# Patient Record
Sex: Female | Born: 1955 | Race: White | Hispanic: No | Marital: Single | Smoking: Current every day smoker
Health system: Southern US, Community
[De-identification: ages and names within clinical notes are randomized; demographics above are authoritative.]

## PROBLEM LIST (undated history)

## (undated) DIAGNOSIS — J449 Chronic obstructive pulmonary disease, unspecified: Secondary | ICD-10-CM

## (undated) DIAGNOSIS — T7840XA Allergy, unspecified, initial encounter: Secondary | ICD-10-CM

## (undated) DIAGNOSIS — C20 Malignant neoplasm of rectum: Secondary | ICD-10-CM

## (undated) DIAGNOSIS — M810 Age-related osteoporosis without current pathological fracture: Secondary | ICD-10-CM

## (undated) DIAGNOSIS — I10 Essential (primary) hypertension: Secondary | ICD-10-CM

## (undated) DIAGNOSIS — M1990 Unspecified osteoarthritis, unspecified site: Secondary | ICD-10-CM

## (undated) DIAGNOSIS — Z5189 Encounter for other specified aftercare: Secondary | ICD-10-CM

## (undated) DIAGNOSIS — J439 Emphysema, unspecified: Secondary | ICD-10-CM

## (undated) DIAGNOSIS — Z8489 Family history of other specified conditions: Secondary | ICD-10-CM

## (undated) DIAGNOSIS — C801 Malignant (primary) neoplasm, unspecified: Secondary | ICD-10-CM

## (undated) DIAGNOSIS — F32A Depression, unspecified: Secondary | ICD-10-CM

## (undated) HISTORY — PX: DILATION AND CURETTAGE OF UTERUS: SHX78

## (undated) HISTORY — PX: COLON SURGERY: SHX602

## (undated) HISTORY — DX: Emphysema, unspecified: J43.9

## (undated) HISTORY — DX: Chronic obstructive pulmonary disease, unspecified: J44.9

## (undated) HISTORY — DX: Malignant neoplasm of rectum: C20

## (undated) HISTORY — PX: OTHER SURGICAL HISTORY: SHX169

## (undated) HISTORY — DX: Encounter for other specified aftercare: Z51.89

## (undated) HISTORY — DX: Essential (primary) hypertension: I10

## (undated) HISTORY — DX: Depression, unspecified: F32.A

## (undated) HISTORY — DX: Allergy, unspecified, initial encounter: T78.40XA

## (undated) HISTORY — PX: TONSILLECTOMY: SUR1361

## (undated) HISTORY — DX: Age-related osteoporosis without current pathological fracture: M81.0

## (undated) HISTORY — DX: Malignant (primary) neoplasm, unspecified: C80.1

## (undated) HISTORY — PX: COLONOSCOPY: SHX174

---

## 1981-07-24 HISTORY — PX: TUBAL LIGATION: SHX77

## 2009-07-24 HISTORY — PX: OTHER SURGICAL HISTORY: SHX169

## 2009-07-24 HISTORY — PX: ILEOSTOMY: SHX1783

## 2010-01-21 HISTORY — PX: ILEOSTOMY CLOSURE: SHX1784

## 2014-04-14 ENCOUNTER — Ambulatory Visit: Payer: Self-pay | Attending: Internal Medicine | Admitting: Internal Medicine

## 2014-04-14 ENCOUNTER — Encounter: Payer: Self-pay | Admitting: Internal Medicine

## 2014-04-14 VITALS — BP 125/84 | HR 71 | Temp 98.0°F | Resp 18 | Ht 63.0 in | Wt 116.4 lb

## 2014-04-14 DIAGNOSIS — Z860100 Personal history of colon polyps, unspecified: Secondary | ICD-10-CM | POA: Insufficient documentation

## 2014-04-14 DIAGNOSIS — F17200 Nicotine dependence, unspecified, uncomplicated: Secondary | ICD-10-CM | POA: Insufficient documentation

## 2014-04-14 DIAGNOSIS — Z85048 Personal history of other malignant neoplasm of rectum, rectosigmoid junction, and anus: Secondary | ICD-10-CM | POA: Insufficient documentation

## 2014-04-14 DIAGNOSIS — G8929 Other chronic pain: Secondary | ICD-10-CM

## 2014-04-14 DIAGNOSIS — M129 Arthropathy, unspecified: Secondary | ICD-10-CM | POA: Insufficient documentation

## 2014-04-14 DIAGNOSIS — M810 Age-related osteoporosis without current pathological fracture: Secondary | ICD-10-CM | POA: Insufficient documentation

## 2014-04-14 DIAGNOSIS — K5989 Other specified functional intestinal disorders: Secondary | ICD-10-CM | POA: Insufficient documentation

## 2014-04-14 DIAGNOSIS — Z8739 Personal history of other diseases of the musculoskeletal system and connective tissue: Secondary | ICD-10-CM

## 2014-04-14 DIAGNOSIS — M545 Low back pain, unspecified: Secondary | ICD-10-CM | POA: Insufficient documentation

## 2014-04-14 DIAGNOSIS — Z139 Encounter for screening, unspecified: Secondary | ICD-10-CM

## 2014-04-14 DIAGNOSIS — M542 Cervicalgia: Secondary | ICD-10-CM | POA: Insufficient documentation

## 2014-04-14 DIAGNOSIS — R195 Other fecal abnormalities: Secondary | ICD-10-CM

## 2014-04-14 DIAGNOSIS — B070 Plantar wart: Secondary | ICD-10-CM | POA: Insufficient documentation

## 2014-04-14 DIAGNOSIS — Z923 Personal history of irradiation: Secondary | ICD-10-CM | POA: Insufficient documentation

## 2014-04-14 DIAGNOSIS — R197 Diarrhea, unspecified: Secondary | ICD-10-CM

## 2014-04-14 DIAGNOSIS — Z9221 Personal history of antineoplastic chemotherapy: Secondary | ICD-10-CM | POA: Insufficient documentation

## 2014-04-14 DIAGNOSIS — K598 Other specified functional intestinal disorders: Secondary | ICD-10-CM

## 2014-04-14 DIAGNOSIS — M1990 Unspecified osteoarthritis, unspecified site: Secondary | ICD-10-CM | POA: Insufficient documentation

## 2014-04-14 HISTORY — DX: Other chronic pain: G89.29

## 2014-04-14 HISTORY — DX: Personal history of other malignant neoplasm of rectum, rectosigmoid junction, and anus: Z85.048

## 2014-04-14 LAB — CBC WITH DIFFERENTIAL/PLATELET
Basophils Absolute: 0 10*3/uL (ref 0.0–0.1)
Basophils Relative: 0 % (ref 0–1)
Eosinophils Absolute: 0.1 10*3/uL (ref 0.0–0.7)
Eosinophils Relative: 1 % (ref 0–5)
HCT: 40.4 % (ref 36.0–46.0)
HEMOGLOBIN: 13.9 g/dL (ref 12.0–15.0)
LYMPHS PCT: 20 % (ref 12–46)
Lymphs Abs: 1 10*3/uL (ref 0.7–4.0)
MCH: 32.4 pg (ref 26.0–34.0)
MCHC: 34.4 g/dL (ref 30.0–36.0)
MCV: 94.2 fL (ref 78.0–100.0)
MONOS PCT: 4 % (ref 3–12)
Monocytes Absolute: 0.2 10*3/uL (ref 0.1–1.0)
Neutro Abs: 3.8 10*3/uL (ref 1.7–7.7)
Neutrophils Relative %: 75 % (ref 43–77)
Platelets: 149 10*3/uL — ABNORMAL LOW (ref 150–400)
RBC: 4.29 MIL/uL (ref 3.87–5.11)
RDW: 14.7 % (ref 11.5–15.5)
WBC: 5.1 10*3/uL (ref 4.0–10.5)

## 2014-04-14 LAB — TSH: TSH: 2.637 u[IU]/mL (ref 0.350–4.500)

## 2014-04-14 LAB — COMPLETE METABOLIC PANEL WITH GFR
ALBUMIN: 4.4 g/dL (ref 3.5–5.2)
ALT: 10 U/L (ref 0–35)
AST: 21 U/L (ref 0–37)
Alkaline Phosphatase: 66 U/L (ref 39–117)
BUN: 12 mg/dL (ref 6–23)
CO2: 23 mEq/L (ref 19–32)
Calcium: 9.1 mg/dL (ref 8.4–10.5)
Chloride: 107 mEq/L (ref 96–112)
Creat: 0.63 mg/dL (ref 0.50–1.10)
GFR, Est African American: >89 mL/min
GLUCOSE: 65 mg/dL — AB (ref 70–99)
POTASSIUM: 4.3 meq/L (ref 3.5–5.3)
Sodium: 144 mEq/L (ref 135–145)
TOTAL PROTEIN: 6.6 g/dL (ref 6.0–8.3)
Total Bilirubin: 0.7 mg/dL (ref 0.2–1.2)

## 2014-04-14 LAB — LIPID PANEL
Cholesterol: 198 mg/dL (ref 0–200)
HDL: 69 mg/dL (ref >39)
LDL CALC: 113 mg/dL — AB (ref 0–99)
Total CHOL/HDL Ratio: 2.9 Ratio
Triglycerides: 82 mg/dL (ref <150)
VLDL: 16 mg/dL (ref 0–40)

## 2014-04-14 LAB — HEMOGLOBIN A1C
Hgb A1c MFr Bld: 5.7 % — ABNORMAL HIGH (ref <5.7)
MEAN PLASMA GLUCOSE: 117 mg/dL — AB (ref <117)

## 2014-04-14 NOTE — Progress Notes (Signed)
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

## 2014-04-14 NOTE — Progress Notes (Signed)
Patient Demographics  Victoria Whitehead, is a 58 y.o. female  QZR:[id number]  HLK:[id number]  DOB - [birth year]  CC:  Chief Complaint  Patient presents with  . Establish Care       HPI: Victoria Whitehead is a 58 y.o. female here today to establish medical care. Patient has history of rectal cancer status post radiotherapy chemotherapy and surgery currently patient following up with oncologist, she has chronic loose bowel movements and takes Imodium, last colonoscopy was 2 years ago and as per patient the remote polyp and she is due for another colonoscopy next year, she has history of arthritis chronic neck pain low back pain takes tramadol when necessary, she also reported to have osteoporosis but does not take any medication currently. Patient reported to have left foot wart and as per patient she saw a podiatrist in the past. Patient has No headache, No chest pain, No abdominal pain - No Nausea, No new weakness tingling or numbness, No Cough - SOB.  Allergies  Allergen Reactions  . Bee Venom    Past Medical History  Diagnosis Date  . Allergy   . Cancer    No current outpatient prescriptions on file prior to visit.   No current facility-administered medications on file prior to visit.   Family History  Problem Relation Age of Onset  . Cancer Sister     breast cancer   . Hypertension Sister   . Diabetes Daughter   . Diabetes Maternal Grandmother   . Heart disease Maternal Grandmother   . Stroke Maternal Grandmother    History   Social History  . Marital Status: Single    Spouse Name: N/A    Number of Children: N/A  . Years of Education: N/A   Occupational History  . Not on file.   Social History Main Topics  . Smoking status: Current Every Day Smoker -- 1.00 packs/day for 40 years    Types: Cigarettes  . Smokeless tobacco: Not on file     Comment: 40 year smoker  . Alcohol Use: Not on file  . Drug Use: Not on file  . Sexual Activity: Not on file   Other  Topics Concern  . Not on file   Social History Narrative  . No narrative on file    Review of Systems: Constitutional: Negative for fever, chills, diaphoresis, activity change, appetite change and fatigue. HENT: Negative for ear pain, nosebleeds, congestion, facial swelling, rhinorrhea, neck pain, neck stiffness and ear discharge.  Eyes: Negative for pain, discharge, redness, itching and visual disturbance. Respiratory: Negative for cough, choking, chest tightness, shortness of breath, wheezing and stridor.  Cardiovascular: Negative for chest pain, palpitations and leg swelling. Gastrointestinal: Negative for abdominal distention. Genitourinary: Negative for dysuria, urgency, frequency, hematuria, flank pain, decreased urine volume, difficulty urinating and dyspareunia.  Musculoskeletal: Negative for back pain, joint swelling, arthralgia and gait problem. Neurological: Negative for dizziness, tremors, seizures, syncope, facial asymmetry, speech difficulty, weakness, light-headedness, numbness and headaches.  Hematological: Negative for adenopathy. Does not bruise/bleed easily. Psychiatric/Behavioral: Negative for hallucinations, behavioral problems, confusion, dysphoric mood, decreased concentration and agitation.    Objective:   Filed Vitals:   04/14/14 1010  BP: 125/84  Pulse: 71  Temp: 98 F (36.7 C)  Resp: 18    Physical Exam: Constitutional: Patient appears well-developed and well-nourished. No distress. HENT: Normocephalic, atraumatic, External right and left ear normal. Oropharynx is clear and moist.  Eyes: Conjunctivae and EOM are normal. PERRLA, no scleral icterus. Neck: Normal  ROM. Neck supple. No JVD. No tracheal deviation. No thyromegaly. CVS: RRR, S1/S2 +, no murmurs, no gallops, no carotid bruit.  Pulmonary: Effort and breath sounds normal, no stridor, rhonchi, wheezes, rales.  Abdominal: Soft. BS +, no distension, tenderness, rebound or guarding.    Musculoskeletal: Normal range of motion. No edema and no tenderness. Left foot sole wart/callus , nontender no signs of infection.  Neuro: Alert. Normal reflexes, muscle tone coordination. No cranial nerve deficit. Skin: Skin is warm and dry. No rash noted. Not diaphoretic. No erythema. No pallor. Psychiatric: Normal mood and affect. Behavior, judgment, thought content normal.  No results found for this basename: WBC, HGB, HCT, MCV, PLT   No results found for this basename: CREATININE, BUN, NA, K, CL, CO2    No results found for this basename: HGBA1C   Lipid Panel  No results found for this basename: chol, trig, hdl, cholhdl, vldl, ldlcalc       Assessment and plan:   1. History of rectal cancer Currently patient following up with oncologist.  2. Arthritis/3. Chronic neck pain Patient takes tramadol when necessary for pain.  4. History of osteoporosis Recheck bone density. - DG Bone Density; Future  5. Loose bowel movement Imodium when necessary.   6. Plantar wart, left foot  - Ambulatory referral to Podiatry  7. Screening Ordered baseline blood work. - CBC with Differential - COMPLETE METABOLIC PANEL WITH GFR - TSH - Lipid panel - Vit D  25 hydroxy (rtn osteoporosis monitoring) - Hemoglobin A1c - MM DIGITAL SCREENING BILATERAL; Future - Ambulatory referral to Gynecology      Health Maintenance -Colonoscopy: uptodate last done in 2013 -Pap Smear: referred to GYN -Mammogram: ordered    Return in about 3 months (around 07/14/2014) for arthritis.   Lorayne Marek, MD

## 2014-04-15 LAB — VITAMIN D 25 HYDROXY (VIT D DEFICIENCY, FRACTURES): Vit D, 25-Hydroxy: 45 ng/mL (ref 30–89)

## 2014-04-17 ENCOUNTER — Other Ambulatory Visit: Payer: Self-pay

## 2014-04-17 ENCOUNTER — Telehealth: Payer: Self-pay

## 2014-04-17 ENCOUNTER — Telehealth: Payer: Self-pay | Admitting: Internal Medicine

## 2014-04-17 DIAGNOSIS — Z0000 Encounter for general adult medical examination without abnormal findings: Secondary | ICD-10-CM

## 2014-04-17 NOTE — Telephone Encounter (Signed)
Pt. Called stating that she is having left side pain under her ribs. Pt. States that the issue was not resolved when pt. Came in for visit. Pt. Also has some concerns about her upcoming pap smear, pt. States that she would like to go to an OBGYN because she had rectal cancer and the radiation makes it difficult for her to have pap smears.Marland KitchenMarland KitchenPlease f/u with pt.

## 2014-04-20 ENCOUNTER — Telehealth: Payer: Self-pay | Admitting: Emergency Medicine

## 2014-04-20 ENCOUNTER — Telehealth: Payer: Self-pay | Admitting: Internal Medicine

## 2014-04-20 NOTE — Telephone Encounter (Signed)
Patient left VM requesting lab results. Please follow up.

## 2014-04-20 NOTE — Telephone Encounter (Signed)
Pt given test results 

## 2014-04-20 NOTE — Telephone Encounter (Signed)
Call and let the patient know that most of her blood work is normal except for her hemoglobin A1c was 5.7%, advise patient for low carbohydrate diet.

## 2014-04-20 NOTE — Telephone Encounter (Signed)
Pt requesting lab results. Please f/u

## 2014-04-21 ENCOUNTER — Encounter: Payer: Self-pay | Admitting: Family Medicine

## 2014-04-22 ENCOUNTER — Encounter: Payer: Self-pay | Admitting: Obstetrics & Gynecology

## 2014-04-24 NOTE — Telephone Encounter (Signed)
Spoke to pt in regards to left abdominal cramping. Pt requesting GYN referral Referral placed 04/17/14 informed pt she will receive a call from office but may take time due to no insurance Denies bleeding,fever,chills,n,v

## 2014-05-05 ENCOUNTER — Ambulatory Visit: Payer: Self-pay

## 2014-05-14 ENCOUNTER — Ambulatory Visit (HOSPITAL_COMMUNITY)
Admission: RE | Admit: 2014-05-14 | Discharge: 2014-05-14 | Disposition: A | Discharge status: Home / Self Care | Payer: Self-pay | Source: Ambulatory Visit | Attending: Internal Medicine | Admitting: Internal Medicine

## 2014-05-14 DIAGNOSIS — Z13820 Encounter for screening for osteoporosis: Secondary | ICD-10-CM | POA: Insufficient documentation

## 2014-05-14 DIAGNOSIS — Z8739 Personal history of other diseases of the musculoskeletal system and connective tissue: Secondary | ICD-10-CM

## 2014-05-14 DIAGNOSIS — Z139 Encounter for screening, unspecified: Secondary | ICD-10-CM

## 2014-05-14 DIAGNOSIS — Z780 Asymptomatic menopausal state: Secondary | ICD-10-CM | POA: Insufficient documentation

## 2014-05-19 ENCOUNTER — Ambulatory Visit: Payer: Self-pay | Attending: Internal Medicine | Admitting: Internal Medicine

## 2014-05-19 ENCOUNTER — Encounter: Payer: Self-pay | Admitting: Internal Medicine

## 2014-05-19 VITALS — BP 136/83 | HR 72 | Temp 98.3°F | Resp 16 | Wt 118.0 lb

## 2014-05-19 DIAGNOSIS — Z91030 Bee allergy status: Secondary | ICD-10-CM

## 2014-05-19 DIAGNOSIS — Z720 Tobacco use: Secondary | ICD-10-CM

## 2014-05-19 DIAGNOSIS — F17200 Nicotine dependence, unspecified, uncomplicated: Secondary | ICD-10-CM | POA: Insufficient documentation

## 2014-05-19 DIAGNOSIS — R195 Other fecal abnormalities: Secondary | ICD-10-CM

## 2014-05-19 DIAGNOSIS — R197 Diarrhea, unspecified: Secondary | ICD-10-CM

## 2014-05-19 DIAGNOSIS — J069 Acute upper respiratory infection, unspecified: Secondary | ICD-10-CM

## 2014-05-19 DIAGNOSIS — G8929 Other chronic pain: Secondary | ICD-10-CM | POA: Insufficient documentation

## 2014-05-19 DIAGNOSIS — M810 Age-related osteoporosis without current pathological fracture: Secondary | ICD-10-CM | POA: Insufficient documentation

## 2014-05-19 DIAGNOSIS — Z91038 Other insect allergy status: Secondary | ICD-10-CM

## 2014-05-19 DIAGNOSIS — M542 Cervicalgia: Secondary | ICD-10-CM | POA: Insufficient documentation

## 2014-05-19 DIAGNOSIS — Z760 Encounter for issue of repeat prescription: Secondary | ICD-10-CM | POA: Insufficient documentation

## 2014-05-19 DIAGNOSIS — Z23 Encounter for immunization: Secondary | ICD-10-CM | POA: Insufficient documentation

## 2014-05-19 HISTORY — DX: Bee allergy status: Z91.030

## 2014-05-19 HISTORY — DX: Tobacco use: Z72.0

## 2014-05-19 MED ORDER — ALENDRONATE SODIUM 70 MG PO TABS: 70.0000 mg | ORAL_TABLET | ORAL | Status: DC

## 2014-05-19 MED ORDER — LOPERAMIDE HCL 2 MG PO CAPS: 2.0000 mg | ORAL_CAPSULE | ORAL | Status: DC | PRN

## 2014-05-19 MED ORDER — EPINEPHRINE 0.3 MG/0.3ML IJ SOAJ: 0.3000 mg | Freq: Once | INTRAMUSCULAR | Status: DC

## 2014-05-19 MED ORDER — TRAMADOL HCL 50 MG PO TABS: 50.0000 mg | ORAL_TABLET | Freq: Four times a day (QID) | ORAL | Status: DC | PRN

## 2014-05-19 NOTE — Progress Notes (Signed)
Patient c/o cough and throat irritation x 2 weeks but has gotten better within last 2 days.   Patient stated she had a "bubble" on her left side of her neck that she was concerned about.  Patient is requesting a flu shot but she wanted to make sure it was safe considering her symptoms.

## 2014-05-19 NOTE — Progress Notes (Signed)
MRN: [id number] Name: Victoria Whitehead  Sex: female Age: 58 y.o. DOB: 1955-07-31  Allergies: Bee venom  Chief Complaint  Patient presents with  . Cough    HPI: Patient is 58 y.o. female who comes today reported to have some URI symptoms for the last 2 weeks but currently improved denies any fever chills chest and shortness of breath, she does smoke cigarettes, I have advised patient to quit smoking, of patient also has history of arthritis and last bowel movements takes tramadol and Imodium, she is requesting refill on his medications, she also history of bee sting allergies and is requesting prescription for EpiPen, patient also had a bone density done which reported osteoporosis.  Past Medical History  Diagnosis Date  . Allergy   . Cancer     Past Surgical History  Procedure Laterality Date  . Colon surgery    . Left knee surgery     . Tonsillectomy    . Colonoscopy      2013 polyps removed      Medication List       This list is accurate as of: 05/19/14 12:47 PM.  Always use your most recent med list.               alendronate 70 MG tablet  Commonly known as:  FOSAMAX  Take 1 tablet (70 mg total) by mouth every 7 (seven) days. Take with a full glass of water on an empty stomach.     EPINEPHrine 0.3 mg/0.3 mL Soaj injection  Commonly known as:  EPIPEN 2-PAK  Inject 0.3 mLs (0.3 mg total) into the muscle once.     loperamide 2 MG capsule  Commonly known as:  IMODIUM  Take 1 capsule (2 mg total) by mouth as needed for diarrhea or loose stools.     traMADol 50 MG tablet  Commonly known as:  ULTRAM  Take 1 tablet (50 mg total) by mouth every 6 (six) hours as needed for moderate pain.        Meds ordered this encounter  Medications  . alendronate (FOSAMAX) 70 MG tablet    Sig: Take 1 tablet (70 mg total) by mouth every 7 (seven) days. Take with a full glass of water on an empty stomach.    Dispense:  4 tablet    Refill:  11  . traMADol (ULTRAM) 50 MG  tablet    Sig: Take 1 tablet (50 mg total) by mouth every 6 (six) hours as needed for moderate pain.    Dispense:  40 tablet    Refill:  0  . loperamide (IMODIUM) 2 MG capsule    Sig: Take 1 capsule (2 mg total) by mouth as needed for diarrhea or loose stools.    Dispense:  30 capsule    Refill:  1  . EPINEPHrine (EPIPEN 2-PAK) 0.3 mg/0.3 mL IJ SOAJ injection    Sig: Inject 0.3 mLs (0.3 mg total) into the muscle once.    Dispense:  1 Device    Refill:  1    Immunization History  Administered Date(s) Administered  . Influenza,inj,Quad PF,36+ Mos 05/19/2014    Family History  Problem Relation Age of Onset  . Cancer Sister     breast cancer   . Hypertension Sister   . Diabetes Daughter   . Diabetes Maternal Grandmother   . Heart disease Maternal Grandmother   . Stroke Maternal Grandmother     History  Substance Use Topics  . Smoking status:  Current Every Day Smoker -- 1.00 packs/day for 40 years    Types: Cigarettes  . Smokeless tobacco: Not on file     Comment: 40 year smoker  . Alcohol Use: Not on file    Review of Systems   As noted in HPI  Filed Vitals:   05/19/14 1050  BP: 136/83  Pulse: 72  Temp: 98.3 F (36.8 C)  Resp: 16    Physical Exam  Physical Exam  Constitutional: No distress.  Eyes: EOM are normal. Pupils are equal, round, and reactive to light.  Cardiovascular: Normal rate and regular rhythm.   Pulmonary/Chest: Breath sounds normal. No respiratory distress. She has no wheezes. She has no rales.  Musculoskeletal: She exhibits no edema.    CBC    Component Value Date/Time   WBC 5.1 04/14/2014 1103   RBC 4.29 04/14/2014 1103   HGB 13.9 04/14/2014 1103   HCT 40.4 04/14/2014 1103   PLT 149* 04/14/2014 1103   MCV 94.2 04/14/2014 1103   LYMPHSABS 1.0 04/14/2014 1103   MONOABS 0.2 04/14/2014 1103   EOSABS 0.1 04/14/2014 1103   BASOSABS 0.0 04/14/2014 1103    CMP     Component Value Date/Time   NA 144 04/14/2014 1103   K 4.3 04/14/2014 1103    CL 107 04/14/2014 1103   CO2 23 04/14/2014 1103   GLUCOSE 65* 04/14/2014 1103   BUN 12 04/14/2014 1103   CREATININE 0.63 04/14/2014 1103   CALCIUM 9.1 04/14/2014 1103   PROT 6.6 04/14/2014 1103   ALBUMIN 4.4 04/14/2014 1103   AST 21 04/14/2014 1103   ALT 10 04/14/2014 1103   ALKPHOS 66 04/14/2014 1103   BILITOT 0.7 04/14/2014 1103   GFRNONAA >89 04/14/2014 1103   GFRAA >89 04/14/2014 1103    Lab Results  Component Value Date/Time   CHOL 198 04/14/2014 11:03 AM    No components found with this basename: hga1c    Lab Results  Component Value Date/Time   AST 21 04/14/2014 11:03 AM    Assessment and Plan  Osteoporosis - Plan: Started patient on alendronate (FOSAMAX) 70 MG tablet  Chronic neck pain - Plan: traMADol (ULTRAM) 50 MG tablet when necessary  Loose bowel movement - Plan: loperamide (IMODIUM) 2 MG capsule when necessary  Bee sting allergy - Plan: EPINEPHrine (EPIPEN 2-PAK) 0.3 mg/0.3 mL IJ SOAJ injection  URI (upper respiratory infection) Now resolved.  Tobacco abuse Again counseled patient to quit smoking.  Health Maintenance  -Mammogram: uptodate  -Vaccinations:  Flu shot today   Return in about 4 months (around 09/19/2014), or if symptoms worsen or fail to improve, for arthritis.  Lorayne Marek, MD

## 2014-05-29 ENCOUNTER — Ambulatory Visit: Payer: No Typology Code available for payment source | Admitting: Podiatry

## 2014-06-03 ENCOUNTER — Ambulatory Visit: Payer: No Typology Code available for payment source | Admitting: Podiatry

## 2014-06-03 ENCOUNTER — Ambulatory Visit: Payer: Self-pay | Attending: Internal Medicine

## 2014-06-03 ENCOUNTER — Encounter: Payer: Self-pay | Admitting: Podiatry

## 2014-06-03 VITALS — BP 153/99 | HR 81 | Resp 13 | Ht 62.0 in | Wt 114.0 lb

## 2014-06-03 DIAGNOSIS — B079 Viral wart, unspecified: Secondary | ICD-10-CM

## 2014-06-03 DIAGNOSIS — B078 Other viral warts: Secondary | ICD-10-CM

## 2014-06-03 DIAGNOSIS — Q828 Other specified congenital malformations of skin: Secondary | ICD-10-CM

## 2014-06-03 NOTE — Progress Notes (Signed)
   Subjective:    Patient ID: Victoria Whitehead, female    DOB: 1956/04/16, 58 y.o.   MRN: [id number]  HPI Patient presents the office today with complaints of a wart on the bottom of her left foot underneath the ball of the second toe. She states that she's had a wart in the similar area before for which was removed. She denies any drainage or redness from around the site. She has pain directly over the area with pressure and with ambulation at times although not consistent. Denies any other lesions. No other complaints at this time.   Review of Systems  Gastrointestinal:       Rectal colon cancer incontinence  All other systems reviewed and are negative.      Objective:   Physical Exam AAO x3, NAD DP/PT pulses palpable bilaterally, CRT less than 3 seconds Protective sensation intact with Simms Weinstein monofilament, vibratory sensation intact, Achilles tendon reflex intact On the plantar aspect of the  Left foot submetatarsal 2 there is a thick hyperkeratotic lesion. Upon debridement there is mild pinpoint bleeding with evidence of verruca. There is no surrounding erythema or drainage. On the plantar aspect of bilateral feet in the heels is multiple punctate areas of hyperkeratotic tissue. Upon debridement there is no pinpoint bleeding or evidence of verruca. No open lesions. No calf pain, swelling, warmth, erythema MMT 5/5, ROM WNL        Assessment & Plan:  58 year old female left foot submetatarsal 2 likely verruca. -Treatment options discussed including alternatives, risks, complications. -Submetatarsal 2 lesion sharply debrided without complications. Discussed various treatment options for verruca and she has elected to proceed with OTC Ocusol salicylic acid. I discussed with her the treatment provided written instructions. Discussed side effects the medication and directed to stop if any are to occur and call the office. -On the plantar aspect of bilateral feet in the heel  area there is punctate hyperkeratotic lesions upon debridement there is evidence of porokeratosis  -Follow-up as needed. Call the office with any questions, concerns, change in symptoms. Discussed that the lesion on the left foot some metatarsal to is not healed or resolved in 2-3 weeks to call the office for follow-up evaluation. Call any questions, concerns, change in symptoms.

## 2014-06-03 NOTE — Patient Instructions (Signed)
Plantar Warts Warts are benign (noncancerous) growths of the outer skin layer. They can occur at any time in life but are most common during childhood and the teen years. Warts can occur on many skin surfaces of the body. When they occur on the underside (sole) of your foot they are called plantar warts. They often emerge in groups with several small warts encircling a larger growth. CAUSES  Human papillomavirus (HPV) is the cause of plantar warts. HPV attacks a break in the skin of the foot. Walking barefoot can lead to exposure to the wart virus. Plantar warts tend to develop over areas of pressure such as the heel and ball of the foot. Plantar warts often grow into the deeper layers of skin. They may spread to other areas of the sole but cannot spread to other areas of the body. SYMPTOMS  You may also notice a growth on the undersurface of your foot. The wart may grow directly into the sole of the foot, or rise above the surface of the skin on the sole of the foot, or both. They are most often flat from pressure. Warts generally do not cause itching but may cause pain in the area of the wart when you put weight on your foot. DIAGNOSIS  Diagnosis is made by physical examination. This means your caregiver discovers it while examining your foot.  TREATMENT  There are many ways to treat plantar warts. However, warts are very tough. Sometimes it is difficult to treat them so that they go away completely and do not grow back. Any treatment must be done regularly to work. If left untreated, most plantar warts will eventually disappear over a period of one to two years. Treatments you can do at home include:  Putting duct tape over the top of the wart (occlusion) has been found to be effective over several months. The duct tape should be removed each night and reapplied until the wart has disappeared.  Placing over-the-counter medications on top of the wart to help kill the wart virus and remove the wart  tissue (salicylic acid, cantharidin, and dichloroacetic acid) are useful. These are called keratolytic agents. These medications make the skin soft and gradually layers will shed away. These compounds are usually placed on the wart each night and then covered with a bandage. They are also available in premedicated bandage form. Avoid surrounding skin when applying these liquids as these medications can burn healthy skin. The treatment may take several months of nightly use to be effective.  Cryotherapy to freeze the wart has recently become available over-the-counter for children 4 years and older. This system makes use of a soft narrow applicator connected to a bottle of compressed cold liquid that is applied directly to the wart. This medication can burn healthy skin and should be used with caution.  As with all over-the-counter medications, read the directions carefully before use. Treatments generally done in your caregiver's office include:  Some aggressive treatments may cause discomfort, discoloration, and scarring of the surrounding skin. The risks and benefits of treatment should be discussed with your caregiver.  Freezing the wart with liquid nitrogen (cryotherapy, see above).  Burning the wart with use of very high heat (cautery).  Injecting medication into the wart.  Surgically removing or laser treatment of the wart.  Your caregiver may refer you to a dermatologist for difficult to treat large-sized warts or large numbers of warts. HOME CARE INSTRUCTIONS   Soak the affected area in warm water. Dry the   area completely when you are done. Remove the top layer of softened skin, then apply the chosen topical medication and reapply a bandage.  Remove the bandage daily and file excess wart tissue (pumice stone works well for this purpose). Repeat the entire process daily or every other day for weeks until the plantar wart disappears.  Several brands of salicylic acid pads are available  as over-the-counter remedies.  Pain can be relieved by wearing a donut bandage. This is a bandage with a hole in it. The bandage is put on with the hole over the wart. This helps take the pressure off the wart and gives pain relief. To help prevent plantar warts:  Wear shoes and socks and change them daily.  Keep feet clean and dry.  Check your feet and your children's feet regularly.  Avoid direct contact with warts on other people.  Have growths or changes on your skin checked by your caregiver. Document Released: 09/30/2003 Document Revised: 11/24/2013 Document Reviewed: 03/10/2009 Exit[employer] Patient Information 2015 [employer]. This information is not intended to replace advice given to you by your health care provider. Make sure you discuss any questions you have with your health care provider.  

## 2014-06-10 ENCOUNTER — Encounter: Payer: Self-pay | Admitting: Obstetrics & Gynecology

## 2014-06-10 ENCOUNTER — Ambulatory Visit (INDEPENDENT_AMBULATORY_CARE_PROVIDER_SITE_OTHER): Payer: Self-pay | Admitting: Obstetrics & Gynecology

## 2014-06-10 VITALS — BP 136/81 | HR 95 | Temp 98.3°F | Wt 111.6 lb

## 2014-06-10 DIAGNOSIS — Z124 Encounter for screening for malignant neoplasm of cervix: Secondary | ICD-10-CM

## 2014-06-10 NOTE — Progress Notes (Signed)
   CLINIC ENCOUNTER NOTE  History:  58 y.o. [id number] here today for pap smear only. History of rectal cancer.  No GYN concerns.  The following portions of the patient's history were reviewed and updated as appropriate: allergies, current medications, past family history, past medical history, past social history, past surgical history and problem list. Normal pap in 2009.  Normal mammogram on 05/26/14.   Review of Systems:  Pertinent items are noted in HPI.  Objective:  Physical Exam BP 136/81 mmHg  Pulse 95  Temp(Src) 98.3 F (36.8 C) (Oral)  Wt 111 lb 9.6 oz (50.621 kg) Gen: NAD Abd: Soft, nontender and nondistended Pelvic: Normal appearing external genitalia; atrophic vaginal mucosa and cervix.  Pap smear obtained. Normal discharge.  Small uterus, no other palpable masses, no uterine or adnexal tenderness   Assessment & Plan:  Pap done, will follow up results and manage accordingly. Routine preventative health maintenance measures emphasized   Verita Schneiders, MD, [facility], [place]

## 2014-06-10 NOTE — Patient Instructions (Signed)
Return to clinic for any scheduled appointments or for any gynecologic concerns as needed.   

## 2014-06-12 LAB — CYTOLOGY - PAP

## 2014-06-24 ENCOUNTER — Other Ambulatory Visit: Payer: Self-pay | Admitting: Internal Medicine

## 2014-06-24 DIAGNOSIS — Z91030 Bee allergy status: Secondary | ICD-10-CM

## 2014-06-24 MED ORDER — EPINEPHRINE 0.3 MG/0.3ML IJ SOAJ: 0.3000 mg | Freq: Once | INTRAMUSCULAR | Status: DC

## 2015-07-14 NOTE — Telephone Encounter (Signed)
error 

## 2015-08-02 ENCOUNTER — Ambulatory Visit: Payer: Self-pay | Admitting: Family Medicine

## 2015-08-03 ENCOUNTER — Telehealth: Payer: Self-pay | Admitting: Internal Medicine

## 2015-08-03 NOTE — Telephone Encounter (Signed)
Pt. Will be re-establishing care with Dr. Adrian Blackwater on 08/18/14.....she has some urgent needs such as getting a scan for her cancer as soon as possible and other potential referrals. Patient would like to know if something can be done with the referrals prior to her visit. Please follow up with patient

## 2015-08-05 ENCOUNTER — Ambulatory Visit: Payer: Medicaid Other | Attending: Family Medicine | Admitting: Family Medicine

## 2015-08-05 ENCOUNTER — Encounter: Payer: Self-pay | Admitting: Family Medicine

## 2015-08-05 VITALS — BP 107/70 | HR 86 | Temp 98.0°F | Resp 16 | Ht 62.0 in | Wt 113.0 lb

## 2015-08-05 DIAGNOSIS — Z0000 Encounter for general adult medical examination without abnormal findings: Secondary | ICD-10-CM | POA: Diagnosis not present

## 2015-08-05 DIAGNOSIS — Z6820 Body mass index (BMI) 20.0-20.9, adult: Secondary | ICD-10-CM | POA: Insufficient documentation

## 2015-08-05 DIAGNOSIS — F17210 Nicotine dependence, cigarettes, uncomplicated: Secondary | ICD-10-CM | POA: Diagnosis not present

## 2015-08-05 DIAGNOSIS — Z114 Encounter for screening for human immunodeficiency virus [HIV]: Secondary | ICD-10-CM

## 2015-08-05 DIAGNOSIS — T63441A Toxic effect of venom of bees, accidental (unintentional), initial encounter: Secondary | ICD-10-CM | POA: Insufficient documentation

## 2015-08-05 DIAGNOSIS — R197 Diarrhea, unspecified: Secondary | ICD-10-CM

## 2015-08-05 DIAGNOSIS — R195 Other fecal abnormalities: Secondary | ICD-10-CM

## 2015-08-05 DIAGNOSIS — M810 Age-related osteoporosis without current pathological fracture: Secondary | ICD-10-CM | POA: Diagnosis not present

## 2015-08-05 DIAGNOSIS — Z91030 Bee allergy status: Secondary | ICD-10-CM

## 2015-08-05 DIAGNOSIS — Z1159 Encounter for screening for other viral diseases: Secondary | ICD-10-CM

## 2015-08-05 DIAGNOSIS — Z79899 Other long term (current) drug therapy: Secondary | ICD-10-CM | POA: Diagnosis not present

## 2015-08-05 DIAGNOSIS — Z85048 Personal history of other malignant neoplasm of rectum, rectosigmoid junction, and anus: Secondary | ICD-10-CM | POA: Insufficient documentation

## 2015-08-05 DIAGNOSIS — Z91038 Other insect allergy status: Secondary | ICD-10-CM

## 2015-08-05 LAB — COMPLETE METABOLIC PANEL WITH GFR
ALBUMIN: 4.3 g/dL (ref 3.6–5.1)
ALT: 13 U/L (ref 6–29)
AST: 26 U/L (ref 10–35)
Alkaline Phosphatase: 58 U/L (ref 33–130)
BILIRUBIN TOTAL: 0.7 mg/dL (ref 0.2–1.2)
BUN: 10 mg/dL (ref 7–25)
CO2: 30 mmol/L (ref 20–31)
CREATININE: 0.49 mg/dL — AB (ref 0.50–1.05)
Calcium: 9.1 mg/dL (ref 8.6–10.4)
Chloride: 104 mmol/L (ref 98–110)
GFR, Est Non African American: >89 mL/min (ref >=60)
Glucose, Bld: 76 mg/dL (ref 65–99)
Potassium: 3.9 mmol/L (ref 3.5–5.3)
Sodium: 142 mmol/L (ref 135–146)
TOTAL PROTEIN: 6.5 g/dL (ref 6.1–8.1)

## 2015-08-05 LAB — CBC
HEMATOCRIT: 40.7 % (ref 36.0–46.0)
Hemoglobin: 13.5 g/dL (ref 12.0–15.0)
MCH: 31.7 pg (ref 26.0–34.0)
MCHC: 33.2 g/dL (ref 30.0–36.0)
MCV: 95.5 fL (ref 78.0–100.0)
MPV: 12 fL (ref 8.6–12.4)
Platelets: 194 10*3/uL (ref 150–400)
RBC: 4.26 MIL/uL (ref 3.87–5.11)
RDW: 14.6 % (ref 11.5–15.5)
WBC: 6 10*3/uL (ref 4.0–10.5)

## 2015-08-05 LAB — POCT GLYCOSYLATED HEMOGLOBIN (HGB A1C): Hemoglobin A1C: 5.4

## 2015-08-05 LAB — GLUCOSE, POCT (MANUAL RESULT ENTRY): POC GLUCOSE: 78 mg/dL (ref 70–99)

## 2015-08-05 MED ORDER — EPINEPHRINE 0.3 MG/0.3ML IJ SOAJ: 0.3000 mg | Freq: Once | INTRAMUSCULAR | Status: DC

## 2015-08-05 MED ORDER — LOPERAMIDE HCL 2 MG PO CAPS: 2.0000 mg | ORAL_CAPSULE | ORAL | Status: DC | PRN

## 2015-08-05 MED ORDER — ZOLEDRONIC ACID 5 MG/100ML IV SOLN: 5.0000 mg | Freq: Once | INTRAVENOUS | Status: DC

## 2015-08-05 NOTE — Patient Instructions (Addendum)
Victoria Whitehead was seen today for referral.  Diagnoses and all orders for this visit:  Healthcare maintenance -     Flu Vaccine QUAD 36+ mos IM -     Ambulatory referral to Gastroenterology -     HgB A1c -     Glucose (CBG)  History of rectal cancer -     Ambulatory referral to Gastroenterology -     CBC  Osteoporosis -     zoledronic acid (RECLAST) 5 MG/100ML SOLN injection; Inject 100 mLs (5 mg total) into the vein once. -     COMPLETE METABOLIC PANEL WITH GFR  Bee sting allergy -     EPINEPHrine (EPIPEN 2-PAK) 0.3 mg/0.3 mL IJ SOAJ injection; Inject 0.3 mLs (0.3 mg total) into the muscle once.  Loose bowel movement -     loperamide (IMODIUM) 2 MG capsule; Take 1 capsule (2 mg total) by mouth as needed for diarrhea or loose stools.  Screening for HIV (human immunodeficiency virus) -     HIV antibody (with reflex)  Need for hepatitis C screening test -     Hepatitis C antibody, reflex    You will be called with results, GI appt and reclast infusion appt  Drop off CD of past images  F/u with me in 3 months   Dr. Adrian Blackwater

## 2015-08-05 NOTE — Progress Notes (Signed)
Requesting GI referral and blood work No pain today  Tobacco user 1 ppday  No suicidal thought in the past two weeks

## 2015-08-05 NOTE — Telephone Encounter (Signed)
No, I will need to meet her and examine her before I can place orders

## 2015-08-05 NOTE — Progress Notes (Addendum)
Patient ID: Deion Zumpano, female   DOB: 04/15/1956, 60 y.o.   MRN: [id number]   Subjective:  Patient ID: Victoria Whitehead, female    DOB: 11/28/1955  Age: 60 y.o. MRN: [id number]  CC: Referral   HPI Jacalynn Cutrona presents for   1. Hx of rectal cancer: Dx in 11/2008. Treated in [place]. She last had follow up CT chest, abdomen and pelvis in 06/2013. She has provided imaging reports and imaged on CD. 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   Requesting colonoscopy and imaging. Has not followed with oncology in > 3 years. Has frequent loose stools since reconstructions. Has neuropathy since chemo. No weight loss, fever, chills, blood in stool.   2. Osteoporosis: last dexa in 2015. Requesting IV infusion treatment. Not tolerating oral bisphosphonate.  3. L sided lower chest/upper abdomen pain: this has been present since 2014. She has had negative CT of chest in 2014. There has been no trauma. Pain is persistent. Does not radiate. There is no associated cough or SOB.   Past Medical History  Diagnosis Date  . Allergy   . Cancer [facility])     Social History  Substance Use Topics  . Smoking status: Current Every Day Smoker -- 1.00 packs/day for 40 years    Types: Cigarettes  . Smokeless tobacco: Never Used     Comment: 40 year smoker  . Alcohol Use: No    Outpatient Prescriptions Prior to Visit  Medication Sig Dispense Refill  . loperamide (IMODIUM) 2 MG capsule Take 1 capsule (2 mg total) by mouth as needed for diarrhea or loose stools. 30 capsule 1  . alendronate (FOSAMAX) 70 MG tablet Take 1 tablet (70 mg total) by mouth every 7 (seven) days. Take with a full glass of water on an empty stomach. 4 tablet 11  . EPINEPHrine (EPIPEN 2-PAK) 0.3 mg/0.3 mL IJ SOAJ injection Inject 0.3 mLs (0.3 mg total) into the  muscle once. 2 Device 3  . traMADol (ULTRAM) 50 MG tablet Take 1 tablet (50 mg total) by mouth every 6 (six) hours as needed for moderate pain. (Patient not taking: Reported on 08/05/2015) 40 tablet 0   No facility-administered medications prior to visit.    ROS Review of Systems  Constitutional: Negative for fever and chills.  Eyes: Negative for visual disturbance.  Respiratory: Negative for shortness of breath.   Cardiovascular: Negative for chest pain.  Gastrointestinal: Positive for diarrhea. Negative for abdominal pain and blood in stool.  Musculoskeletal: Negative for back pain and arthralgias.  Skin: Negative for rash.  Allergic/Immunologic: Negative for immunocompromised state.  Hematological: Negative for adenopathy. Does not bruise/bleed easily.  Psychiatric/Behavioral: Negative for suicidal ideas and dysphoric mood.    Objective:  BP 107/70 mmHg  Pulse 86  Temp(Src) 98 F (36.7 C) (Oral)  Resp 16  Ht 5\' 2"  (1.575 m)  Wt 113 lb (51.256 kg)  BMI 20.66 kg/m2  SpO2 98%  BP/Weight 08/05/2015 06/10/2014 [id number]  Systolic BP [id number]  Diastolic BP 70 81 99  Wt. (Lbs) 113 111.6 114  BMI 20.66 20.41 20.85   Physical Exam  Constitutional: She is oriented to person, place, and time. She appears well-developed and well-nourished. No distress.  HENT:  Head: Normocephalic and atraumatic.  Cardiovascular: Normal rate, regular rhythm, normal heart sounds and intact distal pulses.   Pulmonary/Chest: Effort normal and  breath sounds normal.  Musculoskeletal: She exhibits no edema.  Neurological: She is alert and oriented to person, place, and time.  Skin: Skin is warm and dry. No rash noted.  Psychiatric: She has a normal mood and affect.     Assessment & Plan:   Problem List Items Addressed This Visit    Bee sting allergy (Chronic)   Relevant Medications   EPINEPHrine (EPIPEN 2-PAK) 0.3 mg/0.3 mL IJ SOAJ injection   History of rectal cancer (Chronic)   Relevant  Orders   Ambulatory referral to Gastroenterology   Osteoporosis (Chronic)   Relevant Medications   zoledronic acid (RECLAST) 5 MG/100ML SOLN injection   Other Relevant Orders   COMPLETE METABOLIC PANEL WITH GFR    Other Visit Diagnoses    Healthcare maintenance    -  Primary    Relevant Orders    Flu Vaccine QUAD 36+ mos IM    Ambulatory referral to Gastroenterology    HgB A1c    Glucose (CBG)       Meds ordered this encounter  Medications  . zoledronic acid (RECLAST) 5 MG/100ML SOLN injection    Sig: Inject 100 mLs (5 mg total) into the vein once.    Dispense:  100 mL  . EPINEPHrine (EPIPEN 2-PAK) 0.3 mg/0.3 mL IJ SOAJ injection    Sig: Inject 0.3 mLs (0.3 mg total) into the muscle once.    Dispense:  2 Device    Refill:  3    Follow-up: No Follow-up on file.   Boykin Nearing MD

## 2015-08-06 ENCOUNTER — Telehealth: Payer: Self-pay | Admitting: Family Medicine

## 2015-08-06 ENCOUNTER — Ambulatory Visit: Payer: Medicaid Other | Admitting: Family Medicine

## 2015-08-06 DIAGNOSIS — R6889 Other general symptoms and signs: Secondary | ICD-10-CM

## 2015-08-06 DIAGNOSIS — R0989 Other specified symptoms and signs involving the circulatory and respiratory systems: Secondary | ICD-10-CM

## 2015-08-06 DIAGNOSIS — Z85048 Personal history of other malignant neoplasm of rectum, rectosigmoid junction, and anus: Secondary | ICD-10-CM

## 2015-08-06 DIAGNOSIS — G8929 Other chronic pain: Secondary | ICD-10-CM

## 2015-08-06 DIAGNOSIS — R109 Unspecified abdominal pain: Secondary | ICD-10-CM

## 2015-08-06 LAB — HIV ANTIBODY (ROUTINE TESTING W REFLEX): HIV: NONREACTIVE

## 2015-08-06 LAB — HEPATITIS C ANTIBODY: HCV Ab: NEGATIVE

## 2015-08-06 NOTE — Telephone Encounter (Signed)
[facility] to schedule Reclast infusion Left VM requested call back [place] [facility] # [phone] Also gave direct line # [phone]

## 2015-08-09 ENCOUNTER — Telehealth: Payer: Self-pay | Admitting: Family Medicine

## 2015-08-09 ENCOUNTER — Other Ambulatory Visit: Payer: Self-pay | Admitting: Internal Medicine

## 2015-08-09 MED FILL — EPINEPHRINE 0.3 MG AUTO-INJ: 0.3 | 30 days supply | Qty: 2 | Fill #0

## 2015-08-09 NOTE — Telephone Encounter (Signed)
Patient dropped off paperwork to be reviewed by the doctor to determine which scan is needed. Please follow up.

## 2015-08-09 NOTE — Telephone Encounter (Signed)
Patient came in requesting lab results. °Please follow up. ° °

## 2015-08-11 NOTE — Telephone Encounter (Signed)
Pt return call Date of birth verified by pt  Lab results given  Pt verbalized understanding

## 2015-08-11 NOTE — Telephone Encounter (Signed)
LVM to return call.

## 2015-08-11 NOTE — Telephone Encounter (Signed)
-----   Message from Boykin Nearing, MD sent at 08/06/2015  8:30 AM EST ----- Screening HIV and Hep C negative Normal CBC and CMP

## 2015-08-12 ENCOUNTER — Telehealth: Payer: Self-pay | Admitting: Family Medicine

## 2015-08-12 DIAGNOSIS — M810 Age-related osteoporosis without current pathological fracture: Secondary | ICD-10-CM

## 2015-08-12 NOTE — Telephone Encounter (Signed)
Attempted to schedule reclast infusion at [facility]. Left VM for call back.  Stella, please call them if there is no call back today.

## 2015-08-12 NOTE — Telephone Encounter (Signed)
Received a call back from [facility]  Patient scheduled for IV Reclast 11 AM, 08/15/2104  Please inform patient

## 2015-08-12 NOTE — Telephone Encounter (Signed)
Pt aware of appointment   Pt requesting CT appointment  No CT referral on chart

## 2015-08-13 ENCOUNTER — Telehealth: Payer: Self-pay | Admitting: Family Medicine

## 2015-08-13 MED ORDER — CALCIUM CARBONATE-VITAMIN D 500-400 MG-UNIT PO TABS: 2.0000 | ORAL_TABLET | Freq: Every day | ORAL | Status: DC

## 2015-08-13 NOTE — Telephone Encounter (Addendum)
LVM to call back   Instruction for Monday appointment IV Reclast infusion for osteoporosis.   2 glasses of fluids 2 hrs prior to infusion  1200 mg calcium daily  800 unit vit D daily  Dr. Adrian Blackwater has sent a prescription, but Vit D and calcium can be in any form tolerated

## 2015-08-16 ENCOUNTER — Ambulatory Visit (HOSPITAL_COMMUNITY)
Admission: RE | Admit: 2015-08-16 | Discharge: 2015-08-16 | Disposition: A | Discharge status: Home / Self Care | Payer: Medicaid Other | Source: Ambulatory Visit | Attending: Family Medicine | Admitting: Family Medicine

## 2015-08-16 DIAGNOSIS — G8929 Other chronic pain: Secondary | ICD-10-CM

## 2015-08-16 DIAGNOSIS — R0989 Other specified symptoms and signs involving the circulatory and respiratory systems: Secondary | ICD-10-CM | POA: Insufficient documentation

## 2015-08-16 DIAGNOSIS — M810 Age-related osteoporosis without current pathological fracture: Secondary | ICD-10-CM | POA: Insufficient documentation

## 2015-08-16 DIAGNOSIS — R6889 Other general symptoms and signs: Secondary | ICD-10-CM | POA: Insufficient documentation

## 2015-08-16 DIAGNOSIS — R109 Unspecified abdominal pain: Secondary | ICD-10-CM

## 2015-08-16 HISTORY — DX: Other specified symptoms and signs involving the circulatory and respiratory systems: R09.89

## 2015-08-16 HISTORY — DX: Other chronic pain: G89.29

## 2015-08-16 MED ORDER — ZOLEDRONIC ACID 5 MG/100ML IV SOLN
INTRAVENOUS | Status: AC
  Administered 2015-08-16: 5 mg via INTRAVENOUS
  Filled 2015-08-16: qty 100

## 2015-08-16 MED ORDER — ZOLEDRONIC ACID 5 MG/100ML IV SOLN
5.0000 mg | Freq: Once | INTRAVENOUS | Status: AC
  Administered 2015-08-16: 5 mg via INTRAVENOUS

## 2015-08-16 NOTE — Telephone Encounter (Signed)
Spoke with patient informed her that calcium has been ordered If she cannot tolerate oral calcium tablet she can try OTC chewable or gummie. If she cannot tolerate either of those than calcium in food at least 1200 mg daily = 4-6 servings  Patient agreed with plan and voiced understanding  She asked about f/u CT scan for hx of rectal cancer.  I told her I am still reviewing her last scan and she will be contacted with the plan.

## 2015-08-16 NOTE — Telephone Encounter (Signed)
CTs of chest and abdomen and pelvis with contrast  ordered to evaluate chronic L flank pain and hx of rectal cancer Please schedule  Please inform patient  ENT referral for "left side of throat bubble sensation when she coughs or lifts her head while laying down".

## 2015-08-16 NOTE — Telephone Encounter (Signed)
Pt return call  Information for IV Reclast given glasses of fluids 2 hrs prior to infusion  1200 mg calcium daily  800 unit vit D daily Pt stated do not have Calcium and the appointment schedule in 1 hr  What she can do next?

## 2015-08-16 NOTE — Addendum Note (Signed)
Addended by: Boykin Nearing on: 08/16/2015 08:40 PM   Modules accepted: Orders

## 2015-08-18 NOTE — Telephone Encounter (Signed)
Pt notified CT appointment on Feb 2,2017 at 2;00 arriving 15 min early Please pick up your oral contrast prep at your scheduled location at least 1 day prior to your appointment. Liquids only 4 hours prior to your exam. Any medications can be taken as usual. Pt verbalized understanding

## 2015-08-19 ENCOUNTER — Ambulatory Visit: Payer: Self-pay | Admitting: Family Medicine

## 2015-08-20 ENCOUNTER — Telehealth: Payer: Self-pay | Admitting: Family Medicine

## 2015-08-20 DIAGNOSIS — R109 Unspecified abdominal pain: Principal | ICD-10-CM

## 2015-08-20 DIAGNOSIS — G8929 Other chronic pain: Secondary | ICD-10-CM

## 2015-08-20 NOTE — Telephone Encounter (Signed)
Please inform patient CT chest and CT abdomen and pelvis were denies by [employer] as not clinically indicated.  I have referred her to GI who can order a CT abdomen and pelvis if they find it is necessary  I have ordered CXR

## 2015-08-20 NOTE — Telephone Encounter (Signed)
[employer] Denied the CT chest with Contrast .  Based on [employer] Chest Imaging Guidelines, we are unable to approve the requested study. A recent chest x-ray is supported within the last 60 days for the clinical indication(s) presented. The clinical information provided does not describe these results and, therefore, the requested chest imaging is not indicated at this time.   [employer] Denied the CT Abdomen and [place] with contrast Based on [employer] Oncology Imaging Guidelines, we are unable to approve the requested study. There is insufficient clinical information to approve the requested study. This information would include results of a detailed clinical evaluation, laboratory studies, imaging studies, and/or pathology findings relevant to the imaging procedure requested. Advanced imaging is not supported without these results and, therefore, the requested study is not indicated at this time.

## 2015-08-25 NOTE — Telephone Encounter (Signed)
Pt notified referral placed and CT appointment cancelled  Pt requesting information referral ENT. Pt notified Referral send to [place]. Phone number given to pt

## 2015-08-26 ENCOUNTER — Other Ambulatory Visit: Payer: Self-pay | Admitting: Gastroenterology

## 2015-08-26 ENCOUNTER — Telehealth: Payer: Self-pay | Admitting: Family Medicine

## 2015-08-26 ENCOUNTER — Ambulatory Visit (HOSPITAL_COMMUNITY): Payer: Medicaid Other

## 2015-08-26 MED FILL — GAVILYTE-N SOLUTION: 420 | 1 days supply | Qty: 4000 | Fill #0

## 2015-08-26 NOTE — Telephone Encounter (Signed)
Patient came in wanting to know how often she is needing a CT scan. Patient dropped off paper stating when her last one was and who ordered it. Please follow up.

## 2015-08-27 ENCOUNTER — Encounter (HOSPITAL_COMMUNITY): Payer: Self-pay | Admitting: *Deleted

## 2015-08-30 NOTE — Telephone Encounter (Signed)
I called patient Verified name and DOB.  Her insurance ([employer])  initially denied both the CT chest (08/18/15).  Her insurance has since approved CT chest, notice received on 08/24/2015, this can now be scheduled, she prefers AM. She has colonoscopy scheduled for 09/06/15.   Insurance did not approve CT abdomen and pelvis (08/19/15 and 08/20/15).  So we will go forward with the colonoscopy and depending on the results of the colonoscopy determine if a repeat CT abdomen and pelvis is needed.   Patient agrees with plan and voices understanding.

## 2015-08-30 NOTE — Telephone Encounter (Signed)
  CT chest was normal when last done and does not need repeat CT abdomen and pelvis may need repeat given hx of cancer, will defer decision to GI who is planning her colonoscopy. The decision will be based largely on colonoscopy findings

## 2015-09-03 ENCOUNTER — Telehealth: Payer: Self-pay | Admitting: Family Medicine

## 2015-09-03 DIAGNOSIS — Z85048 Personal history of other malignant neoplasm of rectum, rectosigmoid junction, and anus: Secondary | ICD-10-CM

## 2015-09-03 NOTE — Telephone Encounter (Signed)
Patient states that she was informed by pcp that the chest scan was approved by medicaid...patient called Radiology and they informed her that there is no not of approval in the system.....she would like to follow up on the pelvic scan as well, what is the status?  If she unable to get it please verify when she would be able to.Marland KitchenMarland KitchenMarland KitchenMarland Kitchenplease follow up with patient

## 2015-09-06 ENCOUNTER — Ambulatory Visit (HOSPITAL_COMMUNITY): Payer: Medicaid Other | Admitting: Anesthesiology

## 2015-09-06 ENCOUNTER — Ambulatory Visit (HOSPITAL_COMMUNITY)
Admission: RE | Admit: 2015-09-06 | Discharge: 2015-09-06 | Disposition: A | Discharge status: Home / Self Care | Payer: Medicaid Other | Source: Ambulatory Visit | Attending: Gastroenterology | Admitting: Gastroenterology

## 2015-09-06 ENCOUNTER — Encounter (HOSPITAL_COMMUNITY)
Admission: RE | Disposition: A | Discharge status: Home / Self Care | Payer: Self-pay | Source: Ambulatory Visit | Attending: Gastroenterology

## 2015-09-06 ENCOUNTER — Encounter (HOSPITAL_COMMUNITY): Payer: Self-pay | Admitting: *Deleted

## 2015-09-06 DIAGNOSIS — Z9221 Personal history of antineoplastic chemotherapy: Secondary | ICD-10-CM | POA: Diagnosis not present

## 2015-09-06 DIAGNOSIS — Z1211 Encounter for screening for malignant neoplasm of colon: Secondary | ICD-10-CM | POA: Diagnosis not present

## 2015-09-06 DIAGNOSIS — Z85048 Personal history of other malignant neoplasm of rectum, rectosigmoid junction, and anus: Secondary | ICD-10-CM | POA: Insufficient documentation

## 2015-09-06 DIAGNOSIS — M810 Age-related osteoporosis without current pathological fracture: Secondary | ICD-10-CM | POA: Diagnosis not present

## 2015-09-06 DIAGNOSIS — Z923 Personal history of irradiation: Secondary | ICD-10-CM | POA: Insufficient documentation

## 2015-09-06 DIAGNOSIS — F17210 Nicotine dependence, cigarettes, uncomplicated: Secondary | ICD-10-CM | POA: Insufficient documentation

## 2015-09-06 HISTORY — DX: Family history of other specified conditions: Z84.89

## 2015-09-06 HISTORY — PX: COLONOSCOPY WITH PROPOFOL: SHX5780

## 2015-09-06 HISTORY — DX: Unspecified osteoarthritis, unspecified site: M19.90

## 2015-09-06 SURGERY — COLONOSCOPY WITH PROPOFOL
Anesthesia: Monitor Anesthesia Care

## 2015-09-06 MED ORDER — PROPOFOL 10 MG/ML IV BOLUS
INTRAVENOUS | Status: AC
  Filled 2015-09-06: qty 60

## 2015-09-06 MED ORDER — PROPOFOL 10 MG/ML IV BOLUS
INTRAVENOUS | Status: DC | PRN
  Administered 2015-09-06 (×3): 20 mg via INTRAVENOUS

## 2015-09-06 MED ORDER — PROPOFOL 500 MG/50ML IV EMUL
INTRAVENOUS | Status: DC | PRN
  Administered 2015-09-06: 100 ug/kg/min via INTRAVENOUS

## 2015-09-06 MED ORDER — SODIUM CHLORIDE 0.9 % IV SOLN: INTRAVENOUS | Status: DC

## 2015-09-06 MED ORDER — LACTATED RINGERS IV SOLN
INTRAVENOUS | Status: DC
  Administered 2015-09-06: 1000 mL via INTRAVENOUS

## 2015-09-06 SURGICAL SUPPLY — 21 items

## 2015-09-06 NOTE — Transfer of Care (Signed)
Immediate Anesthesia Transfer of Care Note  Patient: Victoria Whitehead  Procedure(s) Performed: Procedure(s): COLONOSCOPY WITH PROPOFOL (N/A)  Patient Location: PACU and Endoscopy Unit  Anesthesia Type:MAC  Level of Consciousness: awake, alert , oriented and patient cooperative  Airway & Oxygen Therapy: Patient Spontanous Breathing and Patient connected to face mask oxygen  Post-op Assessment: Report given to RN and Post -op Vital signs reviewed and stable  Post vital signs: Reviewed and stable  Last Vitals:  Filed Vitals:   09/06/15 0850  BP: 148/75  Temp: 36.6 C  Resp: 18    Complications: No apparent anesthesia complications

## 2015-09-06 NOTE — Anesthesia Postprocedure Evaluation (Signed)
Anesthesia Post Note  Patient: Victoria Whitehead  Procedure(s) Performed: Procedure(s) (LRB): COLONOSCOPY WITH PROPOFOL (N/A)  Patient location during evaluation: PACU Anesthesia Type: MAC Level of consciousness: awake and alert Pain management: pain level controlled Vital Signs Assessment: post-procedure vital signs reviewed and stable Respiratory status: spontaneous breathing, nonlabored ventilation, respiratory function stable and patient connected to nasal cannula oxygen Cardiovascular status: blood pressure returned to baseline and stable Postop Assessment: no signs of nausea or vomiting Anesthetic complications: no    Last Vitals:  Filed Vitals:   09/06/15 1020 09/06/15 1028  BP: 124/75 125/62  Pulse: 72 66  Temp:    Resp: 22 23    Last Pain: There were no vitals filed for this visit.               Oaklynn Stierwalt L

## 2015-09-06 NOTE — Discharge Instructions (Signed)

## 2015-09-06 NOTE — Op Note (Signed)
Procedure: Surveillance colonoscopy. Rectal cancer surgery performed in May 2010.  Endoscopist: Earle Gell  Premedication: Propofol administered by anesthesia  Procedure: The patient was placed in the left lateral decubitus position. Anal inspection and digital rectal exam were normal. The Pentax pediatric colonoscope was introduced into the rectum and advanced to the cecum. A normal-appearing ileocecal valve and appendiceal orifice were identified. Colonic preparation for the exam today was good. Withdrawal time was 9 minutes  Rectum. Normal. Retroflexed view of the distal rectum was not performed  Sigmoid colon and descending colon. Normal  Splenic flexure. Normal  Transverse colon. Normal  Hepatic flexure. Normal  Ascending colon. Normal  Cecum and ileocecal valve. Normal  Assessment: Normal surveillance colonoscopy post rectal cancer surgery performed in 2010  Recommendation: Schedule surveillance colonoscopy in 5 years

## 2015-09-06 NOTE — Anesthesia Preprocedure Evaluation (Signed)
Anesthesia Evaluation  Patient identified by MRN, date of birth, ID band Patient awake    Reviewed: Allergy & Precautions, H&P , NPO status , Patient's Chart, lab work & pertinent test results  Airway Mallampati: II  TM Distance: >3 FB Neck ROM: full    Dental no notable dental hx. (+) Dental Advisory Given, Teeth Intact   Pulmonary neg pulmonary ROS, Current Smoker,    Pulmonary exam normal breath sounds clear to auscultation       Cardiovascular Exercise Tolerance: Good negative cardio ROS Normal cardiovascular exam Rhythm:regular Rate:Normal     Neuro/Psych negative neurological ROS  negative psych ROS   GI/Hepatic negative GI ROS, Neg liver ROS, Rectal cancer   Endo/Other  negative endocrine ROS  Renal/GU negative Renal ROS  negative genitourinary   Musculoskeletal   Abdominal   Peds  Hematology negative hematology ROS (+)   Anesthesia Other Findings   Reproductive/Obstetrics negative OB ROS                             Anesthesia Physical Anesthesia Plan  ASA: II  Anesthesia Plan: MAC   Post-op Pain Management:    Induction:   Airway Management Planned:   Additional Equipment:   Intra-op Plan:   Post-operative Plan:   Informed Consent: I have reviewed the patients History and Physical, chart, labs and discussed the procedure including the risks, benefits and alternatives for the proposed anesthesia with the patient or authorized representative who has indicated his/her understanding and acceptance.   Dental Advisory Given  Plan Discussed with: CRNA and Surgeon  Anesthesia Plan Comments:         Anesthesia Quick Evaluation

## 2015-09-07 ENCOUNTER — Encounter (HOSPITAL_COMMUNITY): Payer: Self-pay | Admitting: Gastroenterology

## 2015-09-07 NOTE — Telephone Encounter (Signed)
Please inform patient That I did receive an approval notification ( see previous phone note from 08/26/2015) She is advised to call her insurance company.  Regarding her CT of pelvis. This was denied. Her colonoscopy was normal. My plan is that repeat CT is not needed and she can proceed with surveillance colonoscopy in 5 years as recommended by GI.

## 2015-09-08 NOTE — Telephone Encounter (Signed)
Pt requesting call from PCP  Has CT questions

## 2015-09-09 NOTE — Telephone Encounter (Signed)
I called patient. Verified name and DOB.  She desires CT abdomen/pelvis to rule out metastatic disease and for peace of mind, she did not get the year 5 CT abdomen and pelvis that was initially recommended by her oncologist for surveillance.  She was unable to schedule the CT chest even though it was approved.  Plan: Oncology referral placed to determine if CT abdomen/pelvis needed  CT chest was approved, by RMA, Junious Dresser., has been asked to schedule it.  Approval fax was received from patient's insurance [employer] on 08/24/2015.

## 2015-09-13 ENCOUNTER — Telehealth: Payer: Self-pay | Admitting: *Deleted

## 2015-09-15 ENCOUNTER — Encounter (HOSPITAL_COMMUNITY): Payer: Self-pay

## 2015-09-15 ENCOUNTER — Ambulatory Visit (HOSPITAL_COMMUNITY)
Admission: RE | Admit: 2015-09-15 | Discharge: 2015-09-15 | Disposition: A | Discharge status: Home / Self Care | Payer: Medicaid Other | Source: Ambulatory Visit | Attending: Family Medicine | Admitting: Family Medicine

## 2015-09-15 DIAGNOSIS — J984 Other disorders of lung: Secondary | ICD-10-CM | POA: Diagnosis not present

## 2015-09-15 DIAGNOSIS — J439 Emphysema, unspecified: Secondary | ICD-10-CM | POA: Diagnosis not present

## 2015-09-15 DIAGNOSIS — R1012 Left upper quadrant pain: Secondary | ICD-10-CM | POA: Insufficient documentation

## 2015-09-15 DIAGNOSIS — R109 Unspecified abdominal pain: Secondary | ICD-10-CM

## 2015-09-15 DIAGNOSIS — G8929 Other chronic pain: Secondary | ICD-10-CM | POA: Insufficient documentation

## 2015-09-15 MED ORDER — IOHEXOL 300 MG/ML  SOLN
75.0000 mL | Freq: Once | INTRAMUSCULAR | Status: AC | PRN
  Administered 2015-09-15: 75 mL via INTRAVENOUS

## 2015-09-20 NOTE — Telephone Encounter (Signed)
Pt. Called requesting her CT scan results. Please f/u

## 2015-09-21 ENCOUNTER — Encounter: Payer: Self-pay | Admitting: Clinical

## 2015-09-21 NOTE — Progress Notes (Signed)
Depression screen PHQ 2/9 08/05/2015  Decreased Interest 2  Down, Depressed, Hopeless 2  PHQ - 2 Score 4  Altered sleeping 3  Tired, decreased energy 2  Change in appetite 0  Feeling bad or failure about yourself  2  Trouble concentrating 0  Moving slowly or fidgety/restless 0  Suicidal thoughts 0  PHQ-9 Score 11    GAD 7 : Generalized Anxiety Score 08/05/2015  Nervous, Anxious, on Edge 1  Control/stop worrying 0  Worry too much - different things 0  Trouble relaxing 1  Restless 1  Easily annoyed or irritable 0  Afraid - awful might happen 0  Total GAD 7 Score 3

## 2015-09-22 NOTE — Telephone Encounter (Signed)
Pt. Called requesting her CT scan results. Please f/u with pt.

## 2015-09-24 NOTE — Telephone Encounter (Signed)
Patient called  Verified name and DOB.  Gave CT chest results CT chest reveals:  No evidence of metastatic or other active disease within the thorax.  Mild emphysema and scarring at apices (top of lungs), smoking cessation recommended   She will start patches to work on quitting. She feels ready to quit.   She pulled a muscle in her chest moving a computer She went to UC Got pain medicine and steroid. This is improving.

## 2015-09-29 ENCOUNTER — Telehealth: Payer: Self-pay | Admitting: *Deleted

## 2015-09-29 NOTE — Telephone Encounter (Signed)
Oncology Nurse Navigator Documentation  Oncology Nurse Navigator Flowsheets 09/29/2015  Navigator Location [facility]  Navigator Encounter Type Introductory phone call  Received referral today via [employer] for medical oncology appointment. Was treated in [place] in 2015 w/no records in system. Left VM for her her to return call to obtain an appointment and also to gather more info as to her past treatment and where.

## 2015-10-01 ENCOUNTER — Telehealth: Payer: Self-pay | Admitting: *Deleted

## 2015-10-01 NOTE — Telephone Encounter (Signed)
  Oncology Nurse Navigator Documentation  Navigator Location: [facility] (10/01/15 1004) Navigator Encounter Type: Introductory phone call (10/01/15 1004)  Attempted to contact patient to schedule her to be seen. Left 2nd voice mail.

## 2015-10-05 ENCOUNTER — Telehealth: Payer: Self-pay | Admitting: *Deleted

## 2015-10-05 NOTE — Telephone Encounter (Signed)
  Oncology Nurse Navigator Documentation  Navigator Location: [facility] (10/05/15 1633) Navigator Encounter Type: Introductory phone call (10/05/15 1633)  Left VM on mobile # to call office for an appointment. Sent message to PCP that patient is not returning calls.

## 2015-10-06 ENCOUNTER — Telehealth: Payer: Self-pay | Admitting: *Deleted

## 2015-10-06 NOTE — Telephone Encounter (Signed)
  Oncology Nurse Navigator Documentation  Navigator Location: [facility] (10/06/15 1219) Navigator Encounter Type: Telephone (10/06/15 1219) Telephone: Incoming Call;Outgoing Call (10/06/15 1219) :Patient had called to schedule new patient appointment and left VM. Returned her call w/no answer. Left VM

## 2015-10-07 NOTE — H&P (Signed)
Procedure: Surveillance colonoscopy. Rectal cancer surgery performed in [place] in 2010 History:The patient is a 60 year old female born April 25, 1956.She is scheduled to undergo a surveillance colonoscopy today. She was diagnosed with ectal cancer in 2010. She underwent preoperative chemoradiation therapy followed by surgical resection.  Medication allergies: None  Past medical history: Osteoporosis. Rectal cancer surgery performed in May 2010. Current cigarette smoker.40-pack-year history of cigarette smoking. Bee sting allergy.Cesarean section.  Exam: The patient is alert and lying comfortably on the endoscopy stretcher. Abdomen is soft and nontender to palpation. Lungs are clear to auscultation.Cardiac exam reveals a regular rhythm.  Plan:Proceed with surveillance colonoscopy The patient underwent a normal surveillance colonoscopy on 09/06/2015.Schedule a repeat colonoscopy in 5 years.

## 2015-10-11 ENCOUNTER — Telehealth: Payer: Self-pay | Admitting: *Deleted

## 2015-10-11 NOTE — Telephone Encounter (Signed)
Left VM for return call to schedule a new patient appointment (5th attempt to reach her).

## 2015-10-15 ENCOUNTER — Telehealth: Payer: Self-pay | Admitting: *Deleted

## 2015-10-15 NOTE — Telephone Encounter (Signed)
  Oncology Nurse Navigator Documentation  Navigator Location: [facility] (10/15/15 1553) Navigator Encounter Type: Telephone (10/15/15 1553) Telephone: Outgoing Call (10/15/15 1553)    Called to schedule appointment, however she declines at this time. Reports she needs to wait till her sister in in town to come. Tells RN she brought a CD to Dr. Adrian Blackwater with all her [place] records on it. Faxed request to HIM department with Dr. Adrian Blackwater for this information to be downloaded and sent. Informed her we would need at least a 2 week notice to get her in. She also is requesting a female physician.

## 2015-10-26 ENCOUNTER — Telehealth: Payer: Self-pay | Admitting: Family Medicine

## 2015-10-26 NOTE — Telephone Encounter (Signed)
Victoria Whitehead,  Patient's oncologist is requesting her CD of previous CT scans.   Please call patient She dropped off her records including her CD of images on 08/09/2015.  Per phone note from 09/03/2015, I asked you to inform patient that she is  to pick up her records, including her CD images.   I have checked the front office and they are not in the accordion file.   Did she pick them up? If not, was the CD placed up front for pick up.

## 2015-11-02 ENCOUNTER — Telehealth: Payer: Self-pay | Admitting: *Deleted

## 2015-11-02 NOTE — Telephone Encounter (Signed)
  Oncology Nurse Navigator Documentation  Navigator Location: [facility] (11/02/15 1610) Navigator Encounter Type: Telephone (11/02/15 1610) Telephone: Outgoing Call (11/02/15 1610)  Patient had told this RN on 10/15/15 she will call when she is ready to schedule her oncology follow up. Called and left VM requesting she contact navigator regarding any recent decision for her follow up. Will make PCP aware of inability to schedule her per patient barrier.

## 2015-11-03 NOTE — Telephone Encounter (Signed)
LVM to return call.

## 2015-11-03 NOTE — Telephone Encounter (Signed)
Patient returned phone call, please f/u °

## 2015-11-12 NOTE — Telephone Encounter (Signed)
Pt stated still has CD with CT results with her  Notified oncologist need record.  Pt verbalized understanding

## 2015-11-12 NOTE — Telephone Encounter (Signed)
Pt. Returned call. Please f/u with pt. °

## 2015-11-12 NOTE — Telephone Encounter (Signed)
LVM to return call.

## 2015-11-16 ENCOUNTER — Telehealth: Payer: Self-pay | Admitting: Genetic Counselor

## 2015-11-16 NOTE — Telephone Encounter (Signed)
Pt is not interested at this time and decline the appt.

## 2015-12-13 ENCOUNTER — Telehealth: Payer: Self-pay | Admitting: *Deleted

## 2015-12-13 NOTE — Telephone Encounter (Signed)
Oncology Nurse Navigator Documentation  Oncology Nurse Navigator Flowsheets 12/13/2015  Navigator Location [facility]  Navigator Encounter Type Introductory phone call  Telephone -Called patient to inquire if she is ready to make an appointment for her oncology follow up. She reports she is out of town with her mother and will call to schedule appointment when she returns. This RN has made multiple attempts to reach patient in March and April.

## 2020-08-17 NOTE — Progress Notes (Signed)
Subjective:    Victoria Whitehead - 65 y.o. female MRN [id number]  Date of birth: [birth year]  HPI  Victoria Whitehead is to establish care. Patient has a PMH significant for osteoporosis, arthritis, history of rectal cancer, chronic left flank pain, tobacco abuse, and throat fullness.   Current issues and/or concerns: 1. CHOLESTEROL CHECK: Reports recently tested for cholesterol at previous primary care provider. Was not fasting at the time. Was prescribed cholesterol medication. States she did not begin cholesterol medication as she would prefer a repeat cholesterol lab while fasting.  ROS per HPI   Health Maintenance:  Health Maintenance Due  Topic Date Due  . COVID-19 Vaccine (1) Never done  . TETANUS/TDAP  Never done    Past Medical History: Patient Active Problem List   Diagnosis Date Noted  . Chronic left flank pain 08/16/2015  . Throat fullness 08/16/2015  . Tobacco abuse 05/19/2014  . Bee sting allergy 05/19/2014  . History of rectal cancer 04/14/2014  . Arthritis 04/14/2014  . Chronic neck pain 04/14/2014  . Osteoporosis 04/14/2014   Social History   reports that she has been smoking cigarettes. She has a 40.00 pack-year smoking history. She has never used smokeless tobacco. She reports that she does not drink alcohol and does not use drugs.   Family History  family history includes COPD in her mother; Cancer in her sister; Diabetes in her daughter and maternal grandmother; Heart disease in her maternal grandmother; Hypertension in her sister; Stroke in her maternal grandmother.   Medications: reviewed and updated   Objective:   Physical Exam BP 128/82 (BP Location: Left Arm, Patient Position: Sitting)   Pulse 87   Ht 5' 2.05" (1.576 m)   Wt 128 lb 3.2 oz (58.2 kg)   SpO2 95%   BMI 23.41 kg/m  Physical Exam Constitutional:      Appearance: She is normal weight.  HENT:     Head: Normocephalic.  Eyes:     Extraocular Movements: Extraocular movements  intact.     Pupils: Pupils are equal, round, and reactive to light.  Cardiovascular:     Rate and Rhythm: Normal rate and regular rhythm.     Pulses: Normal pulses.     Heart sounds: Normal heart sounds.  Pulmonary:     Effort: Pulmonary effort is normal.     Breath sounds: Normal breath sounds.  Musculoskeletal:     Cervical back: Normal range of motion and neck supple.  Neurological:     General: No focal deficit present.     Mental Status: She is alert and oriented to person, place, and time.  Psychiatric:        Mood and Affect: Mood normal.        Behavior: Behavior normal.      Assessment & Plan:  1. Encounter to establish care: - Patient presents today to establish care.  - Return in 4 to 6 weeks or sooner if needed for annual physical examination, labs, and health maintenance. Arrive fasting meaning having had no food and/or nothing to drink for at least 8 hours prior to appointment.  2. Screening cholesterol level: - Reports recently tested for cholesterol at previous primary care provider. Was not fasting at the time. Was prescribed cholesterol medication. States she did not begin cholesterol medication as she would prefer a repeat cholesterol lab while fasting. -Fasting lipid panel to screen for high cholesterol. Will return within 1 week to have this done.  - Lipid Panel; Future  Durene Fruits, NP 08/18/2020, 10:07 PM Primary Care at [facility]

## 2020-08-18 ENCOUNTER — Ambulatory Visit (INDEPENDENT_AMBULATORY_CARE_PROVIDER_SITE_OTHER): Payer: Medicare Other | Admitting: Family

## 2020-08-18 ENCOUNTER — Other Ambulatory Visit: Payer: Self-pay

## 2020-08-18 ENCOUNTER — Encounter: Payer: Self-pay | Admitting: Family

## 2020-08-18 VITALS — BP 128/82 | HR 87 | Ht 62.05 in | Wt 128.2 lb

## 2020-08-18 DIAGNOSIS — Z13220 Encounter for screening for lipoid disorders: Secondary | ICD-10-CM | POA: Diagnosis not present

## 2020-08-18 DIAGNOSIS — E785 Hyperlipidemia, unspecified: Secondary | ICD-10-CM | POA: Diagnosis not present

## 2020-08-18 DIAGNOSIS — M810 Age-related osteoporosis without current pathological fracture: Secondary | ICD-10-CM

## 2020-08-18 DIAGNOSIS — Z7689 Persons encountering health services in other specified circumstances: Secondary | ICD-10-CM | POA: Diagnosis not present

## 2020-08-18 DIAGNOSIS — I10 Essential (primary) hypertension: Secondary | ICD-10-CM | POA: Diagnosis not present

## 2020-08-18 NOTE — Patient Instructions (Addendum)
Return in 4 to 6 weeks or sooner if needed for annual physical examination, labs, and health maintenance. Arrive fasting meaning having had no food and/or nothing to drink for at least 8 hours prior to appointment.  Return Monday August 23, 2020 for fasting cholesterol lab. Thank you for choosing Primary Care at [facility] for your medical home!    Victoria Whitehead was seen by Camillia Herter, NP today.   Victoria Whitehead's primary care provider is Arnell Mausolf Zachery Dauer, NP.   For the best care possible,  you should try to see Durene Fruits, NP whenever you come to clinic.   We look forward to seeing you again soon!  If you have any questions about your visit today,  please call us at [phone]  Or feel free to reach your provider via [place].   Preventing High Cholesterol Cholesterol is a white, waxy substance similar to fat that the human body needs to help build cells. The liver makes all the cholesterol that a person's body needs. Having high cholesterol (hypercholesterolemia) increases your risk for heart disease and stroke. Extra or excess cholesterol comes from the food that you eat. High cholesterol can often be prevented with diet and lifestyle changes. If you already have high cholesterol, you can control it with diet, lifestyle changes, and medicines. How can high cholesterol affect me? If you have high cholesterol, fatty deposits (plaques) may build up on the walls of your blood vessels. The blood vessels that carry blood away from your heart are called arteries. Plaques make the arteries narrower and stiffer. This in turn can:  Restrict or block blood flow and cause blood clots to form.  Increase your risk for heart attack and stroke. What can increase my risk for high cholesterol? This condition is more likely to develop in people who:  Eat foods that are high in saturated fat or cholesterol. Saturated fat is mostly found in foods that come from animal sources.  Are  overweight.  Are not getting enough exercise.  Have a family history of high cholesterol (familial hypercholesterolemia). What actions can I take to prevent this? Nutrition  Eat less saturated fat.  Avoid trans fats (partially hydrogenated oils). These are often found in margarine and in some baked goods, fried foods, and snacks bought in packages.  Avoid precooked or cured meat, such as bacon, sausages, or meat loaves.  Avoid foods and drinks that have added sugars.  Eat more fruits, vegetables, and whole grains.  Choose healthy sources of protein, such as fish, poultry, lean cuts of red meat, beans, peas, lentils, and nuts.  Choose healthy sources of fat, such as: ? Nuts. ? Vegetable oils, especially olive oil. ? Fish that have healthy fats, such as omega-3 fatty acids. These fish include mackerel or salmon.   Lifestyle  Lose weight if you are overweight. Maintaining a healthy body mass index (BMI) can help prevent or control high cholesterol. It can also lower your risk for diabetes and high blood pressure. Ask your health care provider to help you with a diet and exercise plan to lose weight safely.  Do not use any products that contain nicotine or tobacco, such as cigarettes, e-cigarettes, and chewing tobacco. If you need help quitting, ask your health care provider. Alcohol use  Do not drink alcohol if: ? Your health care provider tells you not to drink. ? You are pregnant, may be pregnant, or are planning to become pregnant.  If you drink alcohol: ?  Limit how much you use to:  0-1 drink a day for women.  0-2 drinks a day for men. ? Be aware of how much alcohol is in your drink. In the [place] one drink equals one 12 oz bottle of beer (355 mL), one 5 oz glass of wine (148 mL), or one 1 oz glass of hard liquor (44 mL). Activity  Get enough exercise. Do exercises as told by your health care provider.  Each week, do at least 150 minutes of exercise that takes a medium  level of effort (moderate-intensity exercise). This kind of exercise: ? Makes your heart beat faster while allowing you to still be able to talk. ? Can be done in short sessions several times a day or longer sessions a few times a week. For example, on 5 days each week, you could walk fast or ride your bike 3 times a day for 10 minutes each time.   Medicines  Your health care provider may recommend medicines to help lower cholesterol. This may be a medicine to lower the amount of cholesterol that your liver makes. You may need medicine if: ? Diet and lifestyle changes have not lowered your cholesterol enough. ? You have high cholesterol and other risk factors for heart disease or stroke.  Take over-the-counter and prescription medicines only as told by your health care provider. General information  Manage your risk factors for high cholesterol. Talk with your health care provider about all your risk factors and how to lower your risk.  Manage other conditions that you have, such as diabetes or high blood pressure (hypertension).  Have blood tests to check your cholesterol levels at regular points in time as told by your health care provider.  Keep all follow-up visits as told by your health care provider. This is important. Where to find more information  American Heart Association: www.heart.org  National Heart, Lung, and Blood Institute: https://wilson-eaton.com/ Summary  High cholesterol increases your risk for heart disease and stroke. By keeping your cholesterol level low, you can reduce your risk for these conditions.  High cholesterol can often be prevented with diet and lifestyle changes.  Work with your health care provider to manage your risk factors, and have your blood tested regularly. This information is not intended to replace advice given to you by your health care provider. Make sure you discuss any questions you have with your health care provider. Document Revised:  04/22/2019 Document Reviewed: 04/22/2019 Elsevier Patient Education  [place].

## 2020-08-18 NOTE — Progress Notes (Signed)
Establish care Cholesterol concerns

## 2020-08-23 ENCOUNTER — Other Ambulatory Visit: Payer: Medicare Other

## 2020-08-23 ENCOUNTER — Other Ambulatory Visit: Payer: Self-pay

## 2020-08-23 DIAGNOSIS — Z13220 Encounter for screening for lipoid disorders: Secondary | ICD-10-CM

## 2020-08-24 ENCOUNTER — Telehealth: Payer: Self-pay | Admitting: Family

## 2020-08-24 DIAGNOSIS — E785 Hyperlipidemia, unspecified: Secondary | ICD-10-CM

## 2020-08-24 HISTORY — DX: Hyperlipidemia, unspecified: E78.5

## 2020-08-24 LAB — LIPID PANEL
Chol/HDL Ratio: 3.5 ratio (ref 0.0–4.4)
Cholesterol, Total: 251 mg/dL — ABNORMAL HIGH (ref 100–199)
HDL: 71 mg/dL (ref >39)
LDL Chol Calc (NIH): 159 mg/dL — ABNORMAL HIGH (ref 0–99)
Triglycerides: 120 mg/dL (ref 0–149)
VLDL Cholesterol Cal: 21 mg/dL (ref 5–40)

## 2020-08-24 MED ORDER — ROSUVASTATIN CALCIUM 20 MG PO TABS: 20.0000 mg | ORAL_TABLET | Freq: Every day | ORAL | 0 refills | Status: DC

## 2020-08-24 NOTE — Addendum Note (Signed)
Addended by: Camillia Herter on: 08/24/2020 01:54 PM   Modules accepted: Orders

## 2020-08-24 NOTE — Telephone Encounter (Signed)
Pt called to know her recent results. Please advise and thank you

## 2020-08-24 NOTE — Progress Notes (Signed)
Please call patient with update.   Cholesterol higher than expected. High cholesterol may increase risk of heart attack and/or stroke. Consider eating more fruits, vegetables, and lean baked meats such as chicken or fish. Moderate intensity exercise at least 150 minutes as tolerated per week may help as well.   Rosuvastatin 20 mg daily prescribed for high cholesterol and sent to pharmacy on file. Will recheck cholesterol in 3 months or sooner if needed.

## 2020-09-02 ENCOUNTER — Telehealth: Payer: Self-pay

## 2020-09-02 NOTE — Telephone Encounter (Signed)
Medication refill, please advice.

## 2020-09-05 MED ORDER — LOSARTAN POTASSIUM 100 MG PO TABS: 100.0000 mg | ORAL_TABLET | Freq: Every day | ORAL | 0 refills | Status: DC

## 2020-09-05 NOTE — Addendum Note (Signed)
Addended by: Camillia Herter on: 09/05/2020 12:10 PM   Modules accepted: Orders

## 2020-09-05 NOTE — Telephone Encounter (Signed)
Please call patient with update.   Losartan refilled for 30 days. Follow-up with primary provider for blood pressure check in 4 weeks or sooner if needed.

## 2020-10-01 ENCOUNTER — Telehealth: Payer: Self-pay | Admitting: Family

## 2020-10-01 NOTE — Telephone Encounter (Addendum)
Pt called concerned if new cholesterol med is causing cramps in feet at night, muscle pain & joint pain. Pt concerned if adding to current medication regimen is causing symptoms. Please advise.  Also pt says she would like to have a bone density for comparison from 6 yrs ago.  Also pt wants handicap placard due to [place] & per states is permanent resident [place] now.

## 2020-10-05 NOTE — Telephone Encounter (Signed)
Att to contact pt to advise of medication change no ans lvm to call ofc

## 2020-10-05 NOTE — Telephone Encounter (Signed)
Rosuvastatin (Crestor) may cause cramping and muscle pain. Rosuvastatin discontinued. We will try course of heart healthy diet and exercise for management of high cholesterol. Consider eating more fruits, vegetables, and lean baked meats such as chicken or fish. Moderate intensity exercise at least 150 minutes as tolerated per week may help as well. Patient encouraged to schedule appointment to have cholesterol rechecked in 3 months or sooner if needed.   Per patient request order placed for DEXA scan.  If possible may we get more details about the limitations of which Handicap Placard is indicated for the patient so that Provider may accurately complete form.

## 2020-10-05 NOTE — Addendum Note (Signed)
Addended by: Camillia Herter on: 10/05/2020 01:01 PM   Modules accepted: Orders

## 2020-10-07 NOTE — Telephone Encounter (Signed)
Rutherford Nail, if possible please gather the Handicap Placard documentation so that I may assist the patient in this matter. Thank you!

## 2020-10-07 NOTE — Telephone Encounter (Signed)
Paperwork completed and ready for provider signature

## 2020-10-07 NOTE — Telephone Encounter (Signed)
Pt is disabled due to frequesncy using bathroom in pants often (feces) & neuropathy in feet & COPD using inhalers.  Please complete handicap placard form.

## 2020-10-12 NOTE — Telephone Encounter (Signed)
Pt called regarding note to see if it was completed.

## 2020-10-12 NOTE — Telephone Encounter (Signed)
Forms completed

## 2020-10-14 NOTE — Telephone Encounter (Signed)
Pt signed form & picked up at front desk. Copy sent for scan.
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
## 2020-11-01 ENCOUNTER — Encounter: Payer: Self-pay | Admitting: Family

## 2020-11-01 ENCOUNTER — Ambulatory Visit (INDEPENDENT_AMBULATORY_CARE_PROVIDER_SITE_OTHER): Payer: Medicare Other | Admitting: Family

## 2020-11-01 ENCOUNTER — Other Ambulatory Visit: Payer: Self-pay

## 2020-11-01 VITALS — BP 113/72 | HR 72 | Ht 62.05 in | Wt 129.8 lb

## 2020-11-01 DIAGNOSIS — Z13220 Encounter for screening for lipoid disorders: Secondary | ICD-10-CM | POA: Diagnosis not present

## 2020-11-01 DIAGNOSIS — J449 Chronic obstructive pulmonary disease, unspecified: Secondary | ICD-10-CM | POA: Diagnosis not present

## 2020-11-01 DIAGNOSIS — D229 Melanocytic nevi, unspecified: Secondary | ICD-10-CM

## 2020-11-01 DIAGNOSIS — J3089 Other allergic rhinitis: Secondary | ICD-10-CM

## 2020-11-01 DIAGNOSIS — R200 Anesthesia of skin: Secondary | ICD-10-CM

## 2020-11-01 DIAGNOSIS — I10 Essential (primary) hypertension: Secondary | ICD-10-CM | POA: Diagnosis not present

## 2020-11-01 MED ORDER — LOSARTAN POTASSIUM 100 MG PO TABS: 100.0000 mg | ORAL_TABLET | Freq: Every day | ORAL | 0 refills | Status: DC

## 2020-11-01 MED ORDER — LORATADINE 10 MG PO TABS: 10.0000 mg | ORAL_TABLET | Freq: Every day | ORAL | 1 refills | Status: DC

## 2020-11-01 NOTE — Progress Notes (Signed)
HTN f/u Cholesterol check  Mole on right side of face

## 2020-11-01 NOTE — Patient Instructions (Addendum)
Continue Losartan for high blood pressure. Return in 3 months or sooner if needed for blood pressure follow-up.  Claritin for allergies.   Appointment with Dr. Asencion Noble for COPD.  Referral to Dermatology.   Referral to Neurology.  Labs today.  Hypertension, Adult Hypertension is another name for high blood pressure. High blood pressure forces your heart to work harder to pump blood. This can cause problems over time. There are two numbers in a blood pressure reading. There is a top number (systolic) over a bottom number (diastolic). It is best to have a blood pressure that is below 120/80. Healthy choices can help lower your blood pressure, or you may need medicine to help lower it. What are the causes? The cause of this condition is not known. Some conditions may be related to high blood pressure. What increases the risk?  Smoking.  Having type 2 diabetes mellitus, high cholesterol, or both.  Not getting enough exercise or physical activity.  Being overweight.  Having too much fat, sugar, calories, or salt (sodium) in your diet.  Drinking too much alcohol.  Having long-term (chronic) kidney disease.  Having a family history of high blood pressure.  Age. Risk increases with age.  Race. You may be at higher risk if you are African American.  Gender. Men are at higher risk than women before age 15. After age 96, women are at higher risk than men.  Having obstructive sleep apnea.  Stress. What are the signs or symptoms?  High blood pressure may not cause symptoms. Very high blood pressure (hypertensive crisis) may cause: ? Headache. ? Feelings of worry or nervousness (anxiety). ? Shortness of breath. ? Nosebleed. ? A feeling of being sick to your stomach (nausea). ? Throwing up (vomiting). ? Changes in how you see. ? Very bad chest pain. ? Seizures. How is this treated?  This condition is treated by making healthy lifestyle changes, such as: ? Eating  healthy foods. ? Exercising more. ? Drinking less alcohol.  Your health care provider may prescribe medicine if lifestyle changes are not enough to get your blood pressure under control, and if: ? Your top number is above 130. ? Your bottom number is above 80.  Your personal target blood pressure may vary. Follow these instructions at home: Eating and drinking  If told, follow the DASH eating plan. To follow this plan: ? Fill one half of your plate at each meal with fruits and vegetables. ? Fill one fourth of your plate at each meal with whole grains. Whole grains include whole-wheat pasta, brown rice, and whole-grain bread. ? Eat or drink low-fat dairy products, such as skim milk or low-fat yogurt. ? Fill one fourth of your plate at each meal with low-fat (lean) proteins. Low-fat proteins include fish, chicken without skin, eggs, beans, and tofu. ? Avoid fatty meat, cured and processed meat, or chicken with skin. ? Avoid pre-made or processed food.  Eat less than 1,500 mg of salt each day.  Do not drink alcohol if: ? Your doctor tells you not to drink. ? You are pregnant, may be pregnant, or are planning to become pregnant.  If you drink alcohol: ? Limit how much you use to:  0-1 drink a day for women.  0-2 drinks a day for men. ? Be aware of how much alcohol is in your drink. In the [place] one drink equals one 12 oz bottle of beer (355 mL), one 5 oz glass of wine (148 mL), or one  1 oz glass of hard liquor (44 mL).   Lifestyle  Work with your doctor to stay at a healthy weight or to lose weight. Ask your doctor what the best weight is for you.  Get at least 30 minutes of exercise most days of the week. This may include walking, swimming, or biking.  Get at least 30 minutes of exercise that strengthens your muscles (resistance exercise) at least 3 days a week. This may include lifting weights or doing Pilates.  Do not use any products that contain nicotine or tobacco, such as  cigarettes, e-cigarettes, and chewing tobacco. If you need help quitting, ask your doctor.  Check your blood pressure at home as told by your doctor.  Keep all follow-up visits as told by your doctor. This is important.   Medicines  Take over-the-counter and prescription medicines only as told by your doctor. Follow directions carefully.  Do not skip doses of blood pressure medicine. The medicine does not work as well if you skip doses. Skipping doses also puts you at risk for problems.  Ask your doctor about side effects or reactions to medicines that you should watch for. Contact a doctor if you:  Think you are having a reaction to the medicine you are taking.  Have headaches that keep coming back (recurring).  Feel dizzy.  Have swelling in your ankles.  Have trouble with your vision. Get help right away if you:  Get a very bad headache.  Start to feel mixed up (confused).  Feel weak or numb.  Feel faint.  Have very bad pain in your: ? Chest. ? Belly (abdomen).  Throw up more than once.  Have trouble breathing. Summary  Hypertension is another name for high blood pressure.  High blood pressure forces your heart to work harder to pump blood.  For most people, a normal blood pressure is less than 120/80.  Making healthy choices can help lower blood pressure. If your blood pressure does not get lower with healthy choices, you may need to take medicine. This information is not intended to replace advice given to you by your health care provider. Make sure you discuss any questions you have with your health care provider. Document Revised: 03/20/2018 Document Reviewed: 03/20/2018 Elsevier Patient Education  2021 [employer].

## 2020-11-02 LAB — LIPID PANEL
Chol/HDL Ratio: 2.1 ratio (ref 0.0–4.4)
Cholesterol, Total: 144 mg/dL (ref 100–199)
HDL: 70 mg/dL (ref >39)
LDL Chol Calc (NIH): 59 mg/dL (ref 0–99)
Triglycerides: 79 mg/dL (ref 0–149)
VLDL Cholesterol Cal: 15 mg/dL (ref 5–40)

## 2020-11-02 LAB — BASIC METABOLIC PANEL
BUN/Creatinine Ratio: 15 (ref 12–28)
BUN: 9 mg/dL (ref 8–27)
CO2: 22 mmol/L (ref 20–29)
Calcium: 9.1 mg/dL (ref 8.7–10.3)
Chloride: 102 mmol/L (ref 96–106)
Creatinine, Ser: 0.59 mg/dL (ref 0.57–1.00)
Glucose: 81 mg/dL (ref 65–99)
Potassium: 4.5 mmol/L (ref 3.5–5.2)
Sodium: 142 mmol/L (ref 134–144)
eGFR: 100 mL/min/{1.73_m2} (ref >59)

## 2020-11-02 NOTE — Progress Notes (Signed)
Kidney function normal.   Cholesterol normal.

## 2020-11-03 ENCOUNTER — Encounter: Payer: Self-pay | Admitting: Critical Care Medicine

## 2020-11-03 ENCOUNTER — Other Ambulatory Visit: Payer: Self-pay

## 2020-11-03 ENCOUNTER — Ambulatory Visit: Payer: Medicare Other | Attending: Critical Care Medicine | Admitting: Critical Care Medicine

## 2020-11-03 VITALS — BP 101/65 | HR 74 | Ht 62.0 in | Wt 133.6 lb

## 2020-11-03 DIAGNOSIS — I10 Essential (primary) hypertension: Secondary | ICD-10-CM | POA: Diagnosis not present

## 2020-11-03 DIAGNOSIS — J432 Centrilobular emphysema: Secondary | ICD-10-CM

## 2020-11-03 DIAGNOSIS — Z79899 Other long term (current) drug therapy: Secondary | ICD-10-CM | POA: Insufficient documentation

## 2020-11-03 DIAGNOSIS — Z8249 Family history of ischemic heart disease and other diseases of the circulatory system: Secondary | ICD-10-CM | POA: Diagnosis not present

## 2020-11-03 DIAGNOSIS — Z01818 Encounter for other preprocedural examination: Secondary | ICD-10-CM

## 2020-11-03 DIAGNOSIS — F17210 Nicotine dependence, cigarettes, uncomplicated: Secondary | ICD-10-CM | POA: Diagnosis not present

## 2020-11-03 DIAGNOSIS — Z720 Tobacco use: Secondary | ICD-10-CM

## 2020-11-03 DIAGNOSIS — Z7951 Long term (current) use of inhaled steroids: Secondary | ICD-10-CM | POA: Insufficient documentation

## 2020-11-03 HISTORY — DX: Centrilobular emphysema: J43.2

## 2020-11-03 MED ORDER — NICOTINE POLACRILEX 4 MG MT LOZG: LOZENGE | OROMUCOSAL | 4 refills | Status: DC

## 2020-11-03 NOTE — Assessment & Plan Note (Signed)
COPD with primary emphysematous component  I reinstructed patient as to proper use of the dry powdered inhaler she will continue to try the Trelegy 1 puff a day  Ambulatory pulse ox in the office was normal  Full pulmonary functions will be obtained

## 2020-11-03 NOTE — Progress Notes (Signed)
Concerns with the Trelegy Ellipta inhaler

## 2020-11-03 NOTE — Progress Notes (Signed)
Subjective:    Patient ID: Victoria Whitehead, female    DOB: 11/29/1955, 65 y.o.   MRN: [id number]  65 y.o.F Copd ref from pcp Minette Brine 11/03/2020 65 year old female history of COPD for 7 years.  She is dyspneic with exertion.  She is not on oxygen.  She is trying Trelegy and having difficulty with the inhaler.  The patient smokes a pack a day of cigarettes tried nicotine patches quit smoking for 6 months then relapsed.  She had a screening CT scan of the chest 7 months ago negative for malignancy.  She is essentially homeless as she now lives with her sister and prior to that her mother.  See shortness of breath assessment below  Shortness of Breath This is a chronic problem. The current episode started more than 1 year ago. The problem occurs daily (rest and exertion, worse in AM, and dinner ). The problem has been gradually worsening. Associated symptoms include headaches, rhinorrhea, sputum production and wheezing. Pertinent negatives include no abdominal pain, chest pain, hemoptysis, leg pain, leg swelling, neck pain, orthopnea, PND or sore throat. Associated symptoms comments: Mucus is clesar. Risk factors include smoking.   Past Medical History:  Diagnosis Date  . Allergy   . Arthritis   . Blood transfusion without reported diagnosis    Phreesia 08/16/2020  . Cancer ([place])   . COPD (chronic obstructive pulmonary disease) ([employer])    Phreesia 08/16/2020  . Depression    Phreesia 08/16/2020  . Emphysema of lung ([place])    Phreesia 08/16/2020  . Family history of adverse reaction to anesthesia    Mom woke up during Colonscopy .  Marland Kitchen Hypertension    Phreesia 08/16/2020  . Osteoporosis    Phreesia 08/16/2020     Family History  Problem Relation Age of Onset  . Cancer Sister        breast cancer   . Hypertension Sister   . Diabetes Daughter   . Diabetes Maternal Grandmother   . Heart disease Maternal Grandmother   . Stroke Maternal Grandmother   . COPD Mother      Social  History   Socioeconomic History  . Marital status: Single    Spouse name: Not on file  . Number of children: Not on file  . Years of education: Not on file  . Highest education level: Not on file  Occupational History  . Not on file  Tobacco Use  . Smoking status: Current Every Day Smoker    Packs/day: 1.00    Years: 40.00    Pack years: 40.00    Types: Cigarettes  . Smokeless tobacco: Never Used  . Tobacco comment: 40 year smoker  Vaping Use  . Vaping Use: Never used  Substance and Sexual Activity  . Alcohol use: No  . Drug use: No  . Sexual activity: Not Currently  Other Topics Concern  . Not on file  Social History Narrative  . Not on file   Social Determinants of Health   Financial Resource Strain: Not on file  Food Insecurity: Not on file  Transportation Needs: Not on file  Physical Activity: Not on file  Stress: Not on file  Social Connections: Not on file  Intimate Partner Violence: Not on file     Allergies  Allergen Reactions  . Other Itching, Rash and Swelling  . Bee Venom Itching, Swelling and Rash  . Hydrogen Peroxide Rash  . Neosporin [Neomycin-Bacitracin Zn-Polymyx] Rash  . Zinc      Outpatient  Medications Prior to Visit  Medication Sig Dispense Refill  . albuterol (VENTOLIN HFA) 108 (90 Base) MCG/ACT inhaler Inhale 2 puffs into the lungs every 4 (four) hours as needed for wheezing or shortness of breath.    . Fluticasone-Umeclidin-Vilant (TRELEGY ELLIPTA) 100-62.5-25 MCG/INH AEPB Inhale into the lungs daily.    Marland Kitchen loratadine (CLARITIN) 10 MG tablet Take 1 tablet (10 mg total) by mouth daily. 30 tablet 1  . losartan (COZAAR) 100 MG tablet Take 1 tablet (100 mg total) by mouth daily. 90 tablet 0  . rosuvastatin (CRESTOR) 20 MG tablet Take 20 mg by mouth daily.     No facility-administered medications prior to visit.      Review of Systems  HENT: Positive for rhinorrhea. Negative for sore throat.   Respiratory: Positive for sputum production,  shortness of breath and wheezing. Negative for hemoptysis.   Cardiovascular: Negative for chest pain, orthopnea, leg swelling and PND.  Gastrointestinal: Negative for abdominal pain.  Musculoskeletal: Negative for neck pain.  Neurological: Positive for headaches.       Objective:   Physical Exam  Vitals:   11/03/20 1057  BP: 101/65  Pulse: 74  SpO2: 99%  Weight: 133 lb 9.6 oz (60.6 kg)  Height: 5\' 2"  (1.575 m)    Gen: Pleasant, well-nourished, in no distress,  normal affect  ENT: No lesions,  mouth clear,  oropharynx clear, no postnasal drip  Neck: No JVD, no TMG, no carotid bruits  Lungs: No use of accessory muscles, no dullness to percussion, distant breath sounds  Cardiovascular: RRR, heart sounds normal, no murmur or gallops, no peripheral edema  Abdomen: soft and NT, no HSM,  BS normal  Musculoskeletal: No deformities, no cyanosis or clubbing  Neuro: alert, non focal  Skin: Warm, no lesions or rashes  No results found.       Assessment & Plan:  I personally reviewed all images and lab data in the [facility] system as well as any outside material available during this office visit and agree with the  radiology impressions.   Centrilobular emphysema (HCC) COPD with primary emphysematous component  I reinstructed patient as to proper use of the dry powdered inhaler she will continue to try the Trelegy 1 puff a day  Ambulatory pulse ox in the office was normal  Full pulmonary functions will be obtained  Tobacco abuse    . Current smoking consumption amount: 1 pack a day  . Dicsussion on advise to quit smoking and smoking impacts: Cardiovascular lung impacts  . Patient's willingness to quit: Willing to try quitting again  . Methods to quit smoking discussed: Behavioral modification  . Medication management of smoking session drugs discussed: Nicotine lozenge  . Resources provided:  AVS   . Setting quit date not established  . Follow-up arranged 2  months   Time spent counseling the patient: 5 minutes     [name] was seen today for copd.  Diagnoses and all orders for this visit:  Centrilobular emphysema ([place]) -     Pulmonary Function Test; Future  Preop testing -     Novel Coronavirus, NAA ([facility])  Tobacco abuse  Other orders -     nicotine polacrilex (NICORETTE MINI) 4 MG lozenge; Take three times daily to quit smoking   **

## 2020-11-03 NOTE — Assessment & Plan Note (Signed)
  .   Current smoking consumption amount: 1 pack a day  . Dicsussion on advise to quit smoking and smoking impacts: Cardiovascular lung impacts  . Patient's willingness to quit: Willing to try quitting again  . Methods to quit smoking discussed: Behavioral modification  . Medication management of smoking session drugs discussed: Nicotine lozenge  . Resources provided:  AVS   . Setting quit date not established  . Follow-up arranged 2 months   Time spent counseling the patient: 5 minutes

## 2020-11-03 NOTE — Patient Instructions (Signed)
Stay on Trelegy take 1 inhalation daily slow your inhalation down  Nicotine lozenges sent to the pharmacy use 3 times daily to quit smoking and follow directions below  Lung function test will be scheduled and you will need a Covid test prior to the lung function test  Return to see Dr. Joya Gaskins in 2 months  If you are still having difficulty with Trelegy please call us back and I will prescribe a different inhaler

## 2020-12-27 ENCOUNTER — Other Ambulatory Visit: Payer: Self-pay | Admitting: Family

## 2020-12-27 DIAGNOSIS — J3089 Other allergic rhinitis: Secondary | ICD-10-CM

## 2020-12-28 ENCOUNTER — Other Ambulatory Visit: Payer: Self-pay | Admitting: Family

## 2020-12-28 DIAGNOSIS — J3089 Other allergic rhinitis: Secondary | ICD-10-CM

## 2021-01-05 ENCOUNTER — Encounter: Payer: Self-pay | Admitting: Critical Care Medicine

## 2021-01-05 ENCOUNTER — Other Ambulatory Visit: Payer: Self-pay

## 2021-01-05 ENCOUNTER — Ambulatory Visit: Payer: Medicare Other | Attending: Critical Care Medicine | Admitting: Critical Care Medicine

## 2021-01-05 VITALS — BP 108/68 | HR 78 | Resp 16 | Wt 137.6 lb

## 2021-01-05 DIAGNOSIS — Z01812 Encounter for preprocedural laboratory examination: Secondary | ICD-10-CM

## 2021-01-05 DIAGNOSIS — R0789 Other chest pain: Secondary | ICD-10-CM

## 2021-01-05 DIAGNOSIS — J432 Centrilobular emphysema: Secondary | ICD-10-CM

## 2021-01-05 DIAGNOSIS — Z20822 Contact with and (suspected) exposure to covid-19: Secondary | ICD-10-CM

## 2021-01-05 DIAGNOSIS — F17210 Nicotine dependence, cigarettes, uncomplicated: Secondary | ICD-10-CM | POA: Diagnosis not present

## 2021-01-05 DIAGNOSIS — R0782 Intercostal pain: Secondary | ICD-10-CM | POA: Diagnosis not present

## 2021-01-05 DIAGNOSIS — R079 Chest pain, unspecified: Secondary | ICD-10-CM | POA: Insufficient documentation

## 2021-01-05 DIAGNOSIS — Z720 Tobacco use: Secondary | ICD-10-CM

## 2021-01-05 MED ORDER — NICOTINE POLACRILEX 4 MG MT LOZG: LOZENGE | OROMUCOSAL | 4 refills | Status: DC

## 2021-01-05 MED ORDER — BEVESPI AEROSPHERE 9-4.8 MCG/ACT IN AERO: 2.0000 | INHALATION_SPRAY | Freq: Two times a day (BID) | RESPIRATORY_TRACT | 11 refills | Status: DC

## 2021-01-05 NOTE — Assessment & Plan Note (Signed)
Advised patient to stop Trelegy. Start Bevespi two puffs twice daily. Continue albuterol two puffs as needed. Will call again to schedule patient for pulmonary function testing.  Follow up in three months.

## 2021-01-05 NOTE — Progress Notes (Signed)
Subjective:    Patient ID: Victoria Whitehead, female    DOB: 01/26/1956, 65 y.o.   MRN: [id number]  m65 y.o.F Copd ref from pcp Minette Brine 11/03/2020 65 year old female history of COPD for 7 years.  She is dyspneic with exertion.  She is not on oxygen.  She is trying Trelegy and having difficulty with the inhaler.  The patient smokes a pack a day of cigarettes tried nicotine patches quit smoking for 6 months then relapsed.  She had a screening CT scan of the chest 7 months ago negative for malignancy.  She is essentially homeless as she now lives with her sister and prior to that her mother.  See shortness of breath assessment below  01/05/2021 Patient presents for follow up regarding her COPD. The patient takes Trelegy once daily which she takes consistently, and albuterol as needed. The patient reports she has still been experiencing a dry, non-productive cough due to taking the Trelegy. The cough is worse in the morning after taking the Trelegy and gets better as the day goes on. She still reports some wheezing and shortness of breath daily which improves with albuterol. She has been using albuterol about 1-3 times daily.   The patient reports intermittent lower left sided chest pain. She states this has been intermittent for many years. The pain is sharp and only occurs when she bends to the left side. She has had several injuries to her left ribs where the pain occurs, the first injury having occurred in 2010. The patient had a low dose CT in [place] which she states was normal. The pain resolves spontaneously when it occurs.   The patient smokes about one pack daily. She has not tried nicotine replacement products and is not ready to quit today. She states she is not ready to quit due to the stress of living with her sister and looking for a home to move to. She states she would be willing to quit once she buys a home.   The patient also occasionally experiences anterior left sided chest pain  at the site where she used to have a chemotherapy port. This pain occurs about once every few months. The pain lasts 1-2 minutes and resolves spontaneously  See shortness of breath assesment below  Shortness of Breath This is a chronic problem. The current episode started more than 1 year ago. The problem occurs daily (rest and exertion, worse in AM, and dinner ). The problem has been unchanged. Associated symptoms include chest pain and wheezing. Pertinent negatives include no abdominal pain, fever, headaches, leg pain, leg swelling, neck pain, orthopnea, PND, rash, rhinorrhea, sore throat, sputum production or vomiting. Associated symptoms comments: Mucus is clesar. Risk factors include smoking. Her past medical history is significant for allergies and COPD.  Past Medical History:  Diagnosis Date   Allergy    Arthritis    Blood transfusion without reported diagnosis    Phreesia 08/16/2020   Cancer ([place])    COPD (chronic obstructive pulmonary disease) ([place])    Phreesia 08/16/2020   Depression    Phreesia 08/16/2020   Emphysema of lung ([place])    Phreesia 08/16/2020   Family history of adverse reaction to anesthesia    Mom woke up during Colonscopy .   Hypertension    Phreesia 08/16/2020   Osteoporosis    Phreesia 08/16/2020     Family History  Problem Relation Age of Onset   Cancer Sister        breast cancer  Hypertension Sister    Diabetes Daughter    Diabetes Maternal Grandmother    Heart disease Maternal Grandmother    Stroke Maternal Grandmother    COPD Mother      Social History   Socioeconomic History   Marital status: Single    Spouse name: Not on file   Number of children: Not on file   Years of education: Not on file   Highest education level: Not on file  Occupational History   Not on file  Tobacco Use   Smoking status: Every Day    Packs/day: 1.00    Years: 40.00    Pack years: 40.00    Types: Cigarettes   Smokeless tobacco: Never   Tobacco  comments:    40 year smoker  Vaping Use   Vaping Use: Never used  Substance and Sexual Activity   Alcohol use: No   Drug use: No   Sexual activity: Not Currently  Other Topics Concern   Not on file  Social History Narrative   Not on file   Social Determinants of Health   Financial Resource Strain: Not on file  Food Insecurity: Not on file  Transportation Needs: Not on file  Physical Activity: Not on file  Stress: Not on file  Social Connections: Not on file  Intimate Partner Violence: Not on file     Allergies  Allergen Reactions   Other Itching, Rash and Swelling   Bee Venom Itching, Swelling and Rash   Hydrogen Peroxide Rash   Neosporin [Neomycin-Bacitracin Zn-Polymyx] Rash   Zinc      Outpatient Medications Prior to Visit  Medication Sig Dispense Refill   albuterol (VENTOLIN HFA) 108 (90 Base) MCG/ACT inhaler Inhale 2 puffs into the lungs every 4 (four) hours as needed for wheezing or shortness of breath.     loratadine (CLARITIN) 10 MG tablet Take 1 tablet by mouth once daily 30 tablet 0   losartan (COZAAR) 100 MG tablet Take 1 tablet (100 mg total) by mouth daily. 90 tablet 0   rosuvastatin (CRESTOR) 20 MG tablet Take 20 mg by mouth daily.     Fluticasone-Umeclidin-Vilant (TRELEGY ELLIPTA) 100-62.5-25 MCG/INH AEPB Inhale into the lungs daily.     nicotine polacrilex (NICORETTE MINI) 4 MG lozenge Take three times daily to quit smoking 100 tablet 4   No facility-administered medications prior to visit.      Review of Systems  Constitutional:  Negative for chills and fever.  HENT:  Negative for congestion, rhinorrhea and sore throat.   Eyes:  Negative for pain.  Respiratory:  Positive for cough, shortness of breath and wheezing. Negative for sputum production.   Cardiovascular:  Positive for chest pain. Negative for orthopnea, leg swelling and PND.  Gastrointestinal:  Negative for abdominal pain, diarrhea, nausea and vomiting.  Genitourinary:  Negative for  difficulty urinating and dysuria.  Musculoskeletal:  Negative for neck pain.  Skin:  Negative for rash.  Neurological:  Negative for light-headedness and headaches.  Psychiatric/Behavioral:  The patient is nervous/anxious.       Objective:   Physical Exam Vitals and nursing note reviewed.  Constitutional:      General: She is not in acute distress. HENT:     Head: Normocephalic and atraumatic.     Mouth/Throat:     Mouth: Mucous membranes are moist.     Comments: Multiple dental carries noted Eyes:     Conjunctiva/sclera: Conjunctivae normal.  Cardiovascular:     Rate and Rhythm: Normal rate and  regular rhythm.     Heart sounds: Normal heart sounds. No murmur heard.   No friction rub. No gallop.  Pulmonary:     Effort: Pulmonary effort is normal.     Breath sounds: Wheezing (Intermittent soft expiratory wheezes) present.  Abdominal:     General: There is no distension.     Palpations: Abdomen is soft.     Tenderness: There is no abdominal tenderness.  Musculoskeletal:        General: Tenderness (Tenderness to palpation of the left lateral chest wall) present.     Cervical back: Normal range of motion.  Skin:    General: Skin is warm.  Neurological:     Mental Status: She is alert and oriented to person, place, and time.  Psychiatric:        Mood and Affect: Mood normal.        Behavior: Behavior normal.    Vitals:   01/05/21 1113  BP: 108/68  Pulse: 78  Resp: 16  SpO2: 95%  Weight: 137 lb 9.6 oz (62.4 kg)           Assessment & Plan:  I personally reviewed all images and lab data in the [facility] system as well as any outside material available during this office visit and agree with the  radiology impressions.   Centrilobular emphysema ([place]) Advised patient to stop Trelegy. Start Bevespi two puffs twice daily. Continue albuterol two puffs as needed. Will call again to schedule patient for pulmonary function testing.  Follow up in three months.   Tobacco  abuse Provided patient education regarding cigarette smoking. Strongly advised smoking cessation.  Discussed use of nicotine lozenges for nicotine replacement. Follow up in three months.      Current smoking consumption amount: 1ppd  Dicsussion on advise to quit smoking and smoking impacts: cv/lung impact  Patient's willingness to quit:  Wants to quit  Methods to quit smoking discussed:  Medications/ behavioral modification  Medication management of smoking session drugs discussed:nicotine replacement  Resources provided:  AVS   Setting quit date  Not est  Follow-up arranged  3 mon   Time spent counseling the patient: 5 min     Left-sided chest pain CT scan of the chest without contrast has been ordered. Follow up in three months.    Analyse was seen today for copd.  Diagnoses and all orders for this visit:  Left-sided chest wall pain -     CT Chest Wo Contrast; Future  Encounter for preoperative screening laboratory testing for COVID-19 virus -     Novel Coronavirus, NAA ([facility])  Intercostal pain -     CT Chest Wo Contrast; Future  Centrilobular emphysema (HCC)  Tobacco abuse  Left-sided chest pain  Other orders -     nicotine polacrilex (NICORETTE MINI) 4 MG lozenge; Take three times daily to quit smoking -     Glycopyrrolate-Formoterol (BEVESPI AEROSPHERE) 9-4.8 MCG/ACT AERO; Inhale 2 Act into the lungs 2 (two) times daily.

## 2021-01-05 NOTE — Assessment & Plan Note (Addendum)
Provided patient education regarding cigarette smoking. Strongly advised smoking cessation.  Discussed use of nicotine lozenges for nicotine replacement. Follow up in three months.      . Current smoking consumption amount: 1ppd  . Dicsussion on advise to quit smoking and smoking impacts: cv/lung impact  . Patient's willingness to quit:  Wants to quit  . Methods to quit smoking discussed:  Medications/ behavioral modification  . Medication management of smoking session drugs discussed:nicotine replacement  . Resources provided:  AVS   . Setting quit date  Not est  . Follow-up arranged  3 mon   Time spent counseling the patient: 5 min

## 2021-01-05 NOTE — Patient Instructions (Signed)
I sent a prescription for

## 2021-01-05 NOTE — Assessment & Plan Note (Signed)
CT scan of the chest without contrast has been ordered. Follow up in three months.

## 2021-01-12 ENCOUNTER — Ambulatory Visit (HOSPITAL_COMMUNITY)
Admission: RE | Admit: 2021-01-12 | Discharge: 2021-01-12 | Disposition: A | Discharge status: Home / Self Care | Payer: Medicare Other | Source: Ambulatory Visit | Attending: Critical Care Medicine | Admitting: Critical Care Medicine

## 2021-01-12 ENCOUNTER — Other Ambulatory Visit: Payer: Self-pay

## 2021-01-12 DIAGNOSIS — R0789 Other chest pain: Secondary | ICD-10-CM | POA: Diagnosis present

## 2021-01-12 DIAGNOSIS — R0782 Intercostal pain: Secondary | ICD-10-CM | POA: Insufficient documentation

## 2021-01-14 ENCOUNTER — Other Ambulatory Visit: Payer: Self-pay | Admitting: Family

## 2021-01-14 ENCOUNTER — Encounter: Payer: Self-pay | Admitting: Critical Care Medicine

## 2021-01-14 DIAGNOSIS — I700 Atherosclerosis of aorta: Secondary | ICD-10-CM

## 2021-01-14 DIAGNOSIS — I2510 Atherosclerotic heart disease of native coronary artery without angina pectoris: Secondary | ICD-10-CM

## 2021-01-14 HISTORY — DX: Atherosclerosis of aorta: I70.0

## 2021-01-14 HISTORY — DX: Atherosclerotic heart disease of native coronary artery without angina pectoris: I25.10

## 2021-01-14 NOTE — Progress Notes (Signed)
Referral placed to Cardiology for aortic atherosclerosis and coronary artery atherosclerosis.

## 2021-01-18 NOTE — Progress Notes (Signed)
Last visit 01/05/2021 with Dr. Joya Gaskins in regards to patient's concerns.  Please confirm with him recommendations on the next steps.

## 2021-01-22 NOTE — Progress Notes (Signed)
Rutherford Nail,   Please call [facility] [address] Ph# [phone] on behalf of the patient to request sooner appointment.   She is currently scheduled with Eleonore Chiquito, MD on 03/07/2021.  Please ask for the already scheduled provider or any provider in the practice that has sooner appointment.

## 2021-01-26 ENCOUNTER — Telehealth: Payer: Self-pay | Admitting: Cardiovascular Disease

## 2021-01-26 NOTE — Telephone Encounter (Signed)
Noted.  Dr.O'Neal have you heard from [facility]. patient is scheduled new patient for 08/15.  Thanks!

## 2021-01-26 NOTE — Telephone Encounter (Signed)
Patient called and said that Dr. Joya Gaskins is supposed to be calling Dr. Audie Box to get the patient in quicker.

## 2021-01-28 ENCOUNTER — Other Ambulatory Visit: Payer: Self-pay | Admitting: Family

## 2021-01-28 DIAGNOSIS — J3089 Other allergic rhinitis: Secondary | ICD-10-CM

## 2021-01-28 DIAGNOSIS — I10 Essential (primary) hypertension: Secondary | ICD-10-CM

## 2021-01-28 NOTE — Telephone Encounter (Signed)
Pt called back in stating she was told by Dr. Joya Gaskins about getting a sooner appt, and wanted to know if that was still possible or does she need to keep current appt. Please advise.

## 2021-01-31 ENCOUNTER — Ambulatory Visit: Payer: Medicare Other | Admitting: Family

## 2021-03-03 ENCOUNTER — Other Ambulatory Visit: Payer: Self-pay | Admitting: Family

## 2021-03-03 DIAGNOSIS — J3089 Other allergic rhinitis: Secondary | ICD-10-CM

## 2021-03-06 NOTE — Progress Notes (Signed)
Cardiology Office Note:   Date:  03/07/2021  NAME:  Victoria Whitehead    MRN: [id number] DOB:  Jun 17, 1956   PCP:  Camillia Herter, NP  Cardiologist:  None  Electrophysiologist:  None   Referring MD: Camillia Herter, NP   Chief Complaint  Patient presents with   Coronary Artery Disease    History of Present Illness:   Victoria Whitehead is a 65 y.o. female with a hx of COPD, tobacco abuse who is being seen today for the evaluation of coronary calcification seen on CT scan at the request of Camillia Herter, NP.  Recently underwent CT scans for chest pain.  Found to have coronary calcifications.  She reports she is had intermittent chest pain symptoms for the last 3 to 4 years.  She reports that occur 3-4 times per year.  She reports that occur while sitting.  They do not occur with exercise.  Described as sharp pain that lasts seconds.  She reports this is at the site of her previous port placed.  She had rectal cancer in the past.  She had a Port-A-Cath in that area.  It is since been removed.  She also gets short of breath with activity.  She has COPD.  She is a 50-pack-year smoker.  She is never had a heart attack or stroke.  EKG in office demonstrates sinus rhythm with an old anteroseptal infarct.  Most recent cholesterol profile shows LDL is well controlled.  Her blood pressure is 130/80.  She does take medication for this.  Family history is significant for heart disease in her mother as well as daughter.  She also reports several siblings who had holes in her heart that were repaired in the 1960s.  She does not exercise routinely but has no major limitations.  She reports if she does exert herself she can get short of breath.  She is still smoking.  Thinking about quitting.  Cardiovascular examination is normal.  No murmurs on exam.  Still smoking 50-pack-year history.  No drug or alcohol use reported.  She is disabled as a result of her rectal cancer.  Problem List COPD Tobacco abuse  -50  pack years  Coronary calcifications on CT Chest (01/13/2021) -LAD/RCA calcifications  4. HLD -T chol 144, HDL 70, LDL 59, TG 79 5. Rectal CA (in remission) 6. HTN  Past Medical History: Past Medical History:  Diagnosis Date   Allergy    Arthritis    Blood transfusion without reported diagnosis    Phreesia 08/16/2020   Cancer ([place])    COPD (chronic obstructive pulmonary disease) ([place])    Phreesia 08/16/2020   Depression    Phreesia 08/16/2020   Emphysema of lung ([place])    Phreesia 08/16/2020   Family history of adverse reaction to anesthesia    Mom woke up during Colonscopy .   Hypertension    Phreesia 08/16/2020   Osteoporosis    Phreesia 08/16/2020   Rectal cancer [facility])     Past Surgical History: Past Surgical History:  Procedure Laterality Date   CESAREAN SECTION  07/24/1978   CESAREAN SECTION N/A    Phreesia 08/16/2020   COLON SURGERY     COLONOSCOPY     2013 polyps removed   COLONOSCOPY WITH PROPOFOL N/A 09/06/2015   Procedure: COLONOSCOPY WITH PROPOFOL;  Surgeon: Garlan Fair, MD;  Location: [facility];  Service: Endoscopy;  Laterality: N/A;   DILATION AND CURETTAGE OF UTERUS     ILEOSTOMY  07/24/2009   ILEOSTOMY CLOSURE  01/21/2010   left knee surgery     had screws and pins-does not bend at knee area   left knee surgery      removal of rectum  07/24/2009   TONSILLECTOMY     TUBAL LIGATION  07/24/1981    Current Medications: Current Meds  Medication Sig   albuterol (VENTOLIN HFA) 108 (90 Base) MCG/ACT inhaler Inhale 2 puffs into the lungs every 4 (four) hours as needed for wheezing or shortness of breath.   aspirin EC 81 MG tablet Take 1 tablet (81 mg total) by mouth daily. Swallow whole.   Glycopyrrolate-Formoterol (BEVESPI AEROSPHERE) 9-4.8 MCG/ACT AERO Inhale 2 Act into the lungs 2 (two) times daily.   loratadine (CLARITIN) 10 MG tablet Take 1 tablet by mouth once daily   losartan (COZAAR) 100 MG tablet Take 1 tablet by mouth once daily    rosuvastatin (CRESTOR) 20 MG tablet Take 20 mg by mouth daily.     Allergies:    Other, Bee venom, Hydrogen peroxide, Neosporin [neomycin-bacitracin zn-polymyx], and Zinc   Social History: Social History   Socioeconomic History   Marital status: Single    Spouse name: Not on file   Number of children: Not on file   Years of education: Not on file   Highest education level: Not on file  Occupational History   Occupation: Disabled  Tobacco Use   Smoking status: Every Day    Packs/day: 1.00    Years: 50.00    Pack years: 50.00    Types: Cigarettes   Smokeless tobacco: Never   Tobacco comments:    40 year smoker  Vaping Use   Vaping Use: Never used  Substance and Sexual Activity   Alcohol use: No   Drug use: No   Sexual activity: Not Currently  Other Topics Concern   Not on file  Social History Narrative   Not on file   Social Determinants of Health   Financial Resource Strain: Not on file  Food Insecurity: Not on file  Transportation Needs: Not on file  Physical Activity: Not on file  Stress: Not on file  Social Connections: Not on file     Family History: The patient's family history includes COPD in her mother; Cancer in her sister; Diabetes in her daughter and maternal grandmother; Heart disease in her maternal grandmother and mother; Hypertension in her sister; Stroke in her maternal grandmother.  ROS:   All other ROS reviewed and negative. Pertinent positives noted in the HPI.     EKGs/Labs/Other Studies Reviewed:   The following studies were personally reviewed by me today:  EKG:  EKG is ordered today.  The ekg ordered today demonstrates normal sinus rhythm heart rate 84, old anteroseptal infarct, and was personally reviewed by me.   Recent Labs: 11/01/2020: BUN 9; Creatinine, Ser 0.59; Potassium 4.5; Sodium 142   Recent Lipid Panel    Component Value Date/Time   CHOL 144 11/01/2020 1127   TRIG 79 11/01/2020 1127   HDL 70 11/01/2020 1127   CHOLHDL  2.1 11/01/2020 1127   CHOLHDL 2.9 04/14/2014 1103   VLDL 16 04/14/2014 1103   LDLCALC 59 11/01/2020 1127    Physical Exam:   VS:  BP 130/80   Pulse 84   Ht '5\' 2"'$  (1.575 m)   Wt 140 lb 3.2 oz (63.6 kg)   SpO2 99%   BMI 25.64 kg/m    Wt Readings from Last 3 Encounters:  03/07/21 140  lb 3.2 oz (63.6 kg)  01/05/21 137 lb 9.6 oz (62.4 kg)  11/03/20 133 lb 9.6 oz (60.6 kg)    General: Well nourished, well developed, in no acute distress Head: Atraumatic, normal size  Eyes: PEERLA, EOMI  Neck: Supple, no JVD Endocrine: No thryomegaly Cardiac: Normal S1, S2; RRR; no murmurs, rubs, or gallops Lungs: Clear to auscultation bilaterally, no wheezing, rhonchi or rales  Abd: Soft, nontender, no hepatomegaly  Ext: No edema, pulses 2+ Musculoskeletal: No deformities, BUE and BLE strength normal and equal Skin: Warm and dry, no rashes   Neuro: Alert and oriented to person, place, time, and situation, CNII-XII grossly intact, no focal deficits  Psych: Normal mood and affect   ASSESSMENT:   DANIESHA WINFORD is a 65 y.o. female who presents for the following: 1. Chest pain of uncertain etiology   2. Coronary artery calcification seen on CAT scan   3. Mixed hyperlipidemia   4. Coronary artery disease involving native coronary artery of native heart without angina pectoris   5. Chest pain, unspecified type     PLAN:   1. Chest pain of uncertain etiology 2. Coronary artery calcification seen on CAT scan 3. Mixed hyperlipidemia 4. Coronary artery disease involving native coronary artery of native heart without angina pectoris -Atypical chest pain.  Described as sharp in the left chest that last seconds.  Can occur several times per year.  No identifiable triggers.  No alleviating factors.  EKG shows old anteroseptal infarct without acute ischemic changes.  No exertional symptoms reported.  She does have calcifications in the LAD and RCA on my review of her CT scan.  I do not believe her  symptoms represent angina but we need to make sure.  I recommended coronary calcium scoring for further modification of her calcium.  I would also like to pursue a Lexi nuclear medicine stress test just to make sure we are not missing obstructive CAD.  She has exceedingly high good HDL cholesterol which may be protective.  Most recent LDL is at goal at 59.  She is on aspirin 81 mg daily.  She will continue Crestor 20 mg daily.  There is mention of valvular calcium.  We will obtain an echocardiogram.  I hear no murmurs or rubs or gallops on exam.  I suspect this will be benign.  We will make sure.  She does report family history of holes in the heart so we need to make sure were not missing anything either.   Shared Decision Making/Informed Consent The risks [chest pain, shortness of breath, cardiac arrhythmias, dizziness, blood pressure fluctuations, myocardial infarction, stroke/transient ischemic attack, nausea, vomiting, allergic reaction, radiation exposure, metallic taste sensation and life-threatening complications (estimated to be 1 in 10,000)], benefits (risk stratification, diagnosing coronary artery disease, treatment guidance) and alternatives of a nuclear stress test were discussed in detail with Ms. Leifer and she agrees to proceed.  Disposition: Return in about 1 year (around 03/07/2022).  Medication Adjustments/Labs and Tests Ordered: Current medicines are reviewed at length with the patient today.  Concerns regarding medicines are outlined above.  Orders Placed This Encounter  Procedures   CT CARDIAC SCORING (SELF PAY ONLY)   Cardiac Stress Test: Informed Consent Details: Physician/Practitioner Attestation; Transcribe to consent form and obtain patient signature   MYOCARDIAL PERFUSION IMAGING   EKG 12-Lead   ECHOCARDIOGRAM COMPLETE    Meds ordered this encounter  Medications   aspirin EC 81 MG tablet    Sig: Take 1 tablet (81  mg total) by mouth daily. Swallow whole.     Dispense:  90 tablet    Refill:  3     Patient Instructions  Medication Instructions:   Start Aspirin 81 mg daily  *If you need a refill on your cardiac medications before your next appointment, please call your pharmacy*   Testing/Procedures: CALCIUM SCORE   Your physician has requested that you have a lexiscan myoview. For further information please visit [email or web address]. Please follow instruction sheet, as given.  Echocardiogram - Your physician has requested that you have an echocardiogram. Echocardiography is a painless test that uses sound waves to create images of your heart. It provides your doctor with information about the size and shape of your heart and how well your heart's chambers and valves are working. This procedure takes approximately one hour. There are no restrictions for this procedure. This will be performed at our [facility] location - [address].   Follow-Up: At [facility], you and your health needs are our priority.  As part of our continuing mission to provide you with exceptional heart care, we have created designated Provider Care Teams.  These Care Teams include your primary Cardiologist (physician) and Advanced Practice Providers (APPs -  Physician Assistants and Nurse Practitioners) who all work together to provide you with the care you need, when you need it.  We recommend signing up for the patient portal called "MyChart".  Sign up information is provided on this After Visit Summary.  MyChart is used to connect with patients for Virtual Visits (Telemedicine).  Patients are able to view lab/test results, encounter notes, upcoming appointments, etc.  Non-urgent messages can be sent to your provider as well.   To learn more about what you can do with MyChart, go to [email or web address].    Your next appointment:   12 month(s)  The format for your next appointment:   In Person  Provider:   You may see Dr.O'Neal or one of the  following Advanced Practice Providers on your designated Care Team:   Almyra Deforest, PA-C Sande Rives, PA-C    Signed, Addison Naegeli. Audie Box, MD, [place]  [address] [place], [place] [address] [phone]  03/07/2021 10:19 AM

## 2021-03-07 ENCOUNTER — Other Ambulatory Visit: Payer: Self-pay

## 2021-03-07 ENCOUNTER — Ambulatory Visit (INDEPENDENT_AMBULATORY_CARE_PROVIDER_SITE_OTHER): Payer: Medicare Other | Admitting: Cardiovascular Disease

## 2021-03-07 ENCOUNTER — Encounter (HOSPITAL_BASED_OUTPATIENT_CLINIC_OR_DEPARTMENT_OTHER): Payer: Self-pay | Admitting: Cardiovascular Disease

## 2021-03-07 VITALS — BP 130/80 | HR 84 | Ht 62.0 in | Wt 140.2 lb

## 2021-03-07 DIAGNOSIS — I2510 Atherosclerotic heart disease of native coronary artery without angina pectoris: Secondary | ICD-10-CM | POA: Diagnosis not present

## 2021-03-07 DIAGNOSIS — E782 Mixed hyperlipidemia: Secondary | ICD-10-CM

## 2021-03-07 DIAGNOSIS — R079 Chest pain, unspecified: Secondary | ICD-10-CM

## 2021-03-07 MED ORDER — ASPIRIN EC 81 MG PO TBEC: 81.0000 mg | DELAYED_RELEASE_TABLET | Freq: Every day | ORAL | 3 refills | Status: AC

## 2021-03-07 NOTE — Patient Instructions (Signed)
Medication Instructions:   Start Aspirin 81 mg daily  *If you need a refill on your cardiac medications before your next appointment, please call your pharmacy*   Testing/Procedures: CALCIUM SCORE   Your physician has requested that you have a lexiscan myoview. For further information please visit [email or web address]. Please follow instruction sheet, as given.  Echocardiogram - Your physician has requested that you have an echocardiogram. Echocardiography is a painless test that uses sound waves to create images of your heart. It provides your doctor with information about the size and shape of your heart and how well your heart's chambers and valves are working. This procedure takes approximately one hour. There are no restrictions for this procedure. This will be performed at our [facility] location - [address].   Follow-Up: At [facility], you and your health needs are our priority.  As part of our continuing mission to provide you with exceptional heart care, we have created designated Provider Care Teams.  These Care Teams include your primary Cardiologist (physician) and Advanced Practice Providers (APPs -  Physician Assistants and Nurse Practitioners) who all work together to provide you with the care you need, when you need it.  We recommend signing up for the patient portal called "MyChart".  Sign up information is provided on this After Visit Summary.  MyChart is used to connect with patients for Virtual Visits (Telemedicine).  Patients are able to view lab/test results, encounter notes, upcoming appointments, etc.  Non-urgent messages can be sent to your provider as well.   To learn more about what you can do with MyChart, go to [email or web address].    Your next appointment:   12 month(s)  The format for your next appointment:   In Person  Provider:   You may see Dr.O'Neal or one of the following Advanced Practice Providers on your designated Care Team:    Almyra Deforest, PA-C [place], [place]

## 2021-03-24 ENCOUNTER — Telehealth (HOSPITAL_COMMUNITY): Payer: Self-pay

## 2021-03-24 NOTE — Telephone Encounter (Signed)
Spoke with the patient, detailed instructions given. She stated that she would be here for her test. Asked to call back with any questions. Victoria Whitehead EMTP 

## 2021-03-29 ENCOUNTER — Ambulatory Visit (INDEPENDENT_AMBULATORY_CARE_PROVIDER_SITE_OTHER)
Admission: RE | Admit: 2021-03-29 | Discharge: 2021-03-29 | Disposition: A | Discharge status: Home / Self Care | Payer: Self-pay | Source: Ambulatory Visit | Attending: Cardiovascular Disease | Admitting: Cardiovascular Disease

## 2021-03-29 ENCOUNTER — Ambulatory Visit (HOSPITAL_BASED_OUTPATIENT_CLINIC_OR_DEPARTMENT_OTHER): Payer: Medicare Other

## 2021-03-29 ENCOUNTER — Other Ambulatory Visit: Payer: Self-pay

## 2021-03-29 ENCOUNTER — Ambulatory Visit (HOSPITAL_COMMUNITY): Payer: Medicare Other | Attending: Internal Medicine

## 2021-03-29 DIAGNOSIS — R079 Chest pain, unspecified: Secondary | ICD-10-CM

## 2021-03-29 LAB — ECHOCARDIOGRAM COMPLETE
Area-P 1/2: 5.54 cm2
Height: 62 in
MV M vel: 4.82 m/s
MV Peak grad: 92.9 mmHg
Radius: 0.35 cm
S' Lateral: 3.45 cm
Weight: 2240 oz

## 2021-03-29 LAB — MYOCARDIAL PERFUSION IMAGING
Base ST Depression (mm): 0 mm
LV dias vol: 49 mL (ref 46–106)
LV sys vol: 15 mL
Nuc Stress EF: 69 %
Peak HR: 112 {beats}/min
Rest HR: 87 {beats}/min
Rest Nuclear Isotope Dose: 11 mCi
SDS: 3
SRS: 0
SSS: 3
ST Depression (mm): 0 mm
Stress Nuclear Isotope Dose: 31.2 mCi
TID: 0.89

## 2021-03-29 MED ORDER — TECHNETIUM TC 99M TETROFOSMIN IV KIT
31.2000 | PACK | Freq: Once | INTRAVENOUS | Status: AC | PRN
  Administered 2021-03-29: 31.2 via INTRAVENOUS
  Filled 2021-03-29: qty 32

## 2021-03-29 MED ORDER — TECHNETIUM TC 99M TETROFOSMIN IV KIT
11.0000 | PACK | Freq: Once | INTRAVENOUS | Status: AC | PRN
  Administered 2021-03-29: 11 via INTRAVENOUS
  Filled 2021-03-29: qty 11

## 2021-03-29 MED ORDER — REGADENOSON 0.4 MG/5ML IV SOLN
0.4000 mg | Freq: Once | INTRAVENOUS | Status: AC
  Administered 2021-03-29: 0.4 mg via INTRAVENOUS

## 2021-03-30 ENCOUNTER — Other Ambulatory Visit: Payer: Self-pay | Admitting: Family

## 2021-03-30 DIAGNOSIS — J3089 Other allergic rhinitis: Secondary | ICD-10-CM

## 2021-03-31 ENCOUNTER — Other Ambulatory Visit: Payer: Self-pay

## 2021-03-31 ENCOUNTER — Ambulatory Visit
Admission: RE | Admit: 2021-03-31 | Discharge: 2021-03-31 | Disposition: A | Discharge status: Home / Self Care | Payer: Medicare Other | Source: Ambulatory Visit | Attending: Family | Admitting: Family

## 2021-03-31 DIAGNOSIS — M810 Age-related osteoporosis without current pathological fracture: Secondary | ICD-10-CM

## 2021-04-01 ENCOUNTER — Other Ambulatory Visit: Payer: Self-pay | Admitting: Family

## 2021-04-01 DIAGNOSIS — M810 Age-related osteoporosis without current pathological fracture: Secondary | ICD-10-CM

## 2021-04-01 NOTE — Progress Notes (Signed)
Please call patient with update.   Your bone density returned and did show some osteopenia. This means the bones are not as thick as they once were, which is common in women after menopause.  This is not osteoporosis, but could lead to it.    We recommend a diet high in calcium (dark green leafy vegetables). We also recommend plenty of weight bearing exercise such as walking and resistance training as tolerated.     Please schedule a lab only visit to have calcium and vitamin D checked. If this returns low we will prescribe a calcium supplement and/or vitamin D supplement for added bone health.   Repeat bone density scan in 1 to 2 years.

## 2021-04-09 NOTE — Progress Notes (Deleted)
Patient ID: ANNACLARA SEARCH, female    DOB: 1955-12-22  MRN: [id number]  CC: Hypertension Follow-Up  Subjective: Victoria Whitehead is a 65 y.o. female who presents for hypertension follow-up.   Her concerns today include:   HYPERTENSION FOLLOW-UP: 11/01/2020: - Continue Losartan as prescribed.    TAKING CRESTOR/CHOLESTEROL MED  Patient Active Problem List   Diagnosis Date Noted   CAD seen on CT imaging 01/14/2021   Aortic atherosclerosis ([place]) 01/14/2021   Left-sided chest pain 01/05/2021   Centrilobular emphysema ([place]) 11/03/2020   Hyperlipidemia 08/24/2020   Chronic left flank pain 08/16/2015   Throat fullness 08/16/2015   Tobacco abuse 05/19/2014   Bee sting allergy 05/19/2014   History of rectal cancer 04/14/2014   Arthritis 04/14/2014   Chronic neck pain 04/14/2014   Osteoporosis 04/14/2014     Current Outpatient Medications on File Prior to Visit  Medication Sig Dispense Refill   albuterol (VENTOLIN HFA) 108 (90 Base) MCG/ACT inhaler Inhale 2 puffs into the lungs every 4 (four) hours as needed for wheezing or shortness of breath.     aspirin EC 81 MG tablet Take 1 tablet (81 mg total) by mouth daily. Swallow whole. 90 tablet 3   Glycopyrrolate-Formoterol (BEVESPI AEROSPHERE) 9-4.8 MCG/ACT AERO Inhale 2 Act into the lungs 2 (two) times daily. 10.7 g 11   loratadine (CLARITIN) 10 MG tablet Take 1 tablet by mouth once daily 30 tablet 0   losartan (COZAAR) 100 MG tablet Take 1 tablet by mouth once daily 90 tablet 0   nicotine polacrilex (NICORETTE MINI) 4 MG lozenge Take three times daily to quit smoking (Patient not taking: Reported on 03/07/2021) 100 tablet 4   rosuvastatin (CRESTOR) 20 MG tablet Take 20 mg by mouth daily.     No current facility-administered medications on file prior to visit.    Allergies  Allergen Reactions   Other Itching, Rash and Swelling   Bee Venom Itching, Swelling and Rash   Hydrogen Peroxide Rash   Neosporin [Neomycin-Bacitracin  Zn-Polymyx] Rash   Zinc     Social History   Socioeconomic History   Marital status: Single    Spouse name: Not on file   Number of children: Not on file   Years of education: Not on file   Highest education level: Not on file  Occupational History   Occupation: Disabled  Tobacco Use   Smoking status: Every Day    Packs/day: 1.00    Years: 50.00    Pack years: 50.00    Types: Cigarettes   Smokeless tobacco: Never   Tobacco comments:    40 year smoker  Vaping Use   Vaping Use: Never used  Substance and Sexual Activity   Alcohol use: No   Drug use: No   Sexual activity: Not Currently  Other Topics Concern   Not on file  Social History Narrative   Not on file   Social Determinants of Health   Financial Resource Strain: Not on file  Food Insecurity: Not on file  Transportation Needs: Not on file  Physical Activity: Not on file  Stress: Not on file  Social Connections: Not on file  Intimate Partner Violence: Not on file    Family History  Problem Relation Age of Onset   Heart disease Mother    COPD Mother    Cancer Sister        breast cancer    Hypertension Sister    Diabetes Maternal Grandmother    Heart disease Maternal  Grandmother    Stroke Maternal Grandmother    Diabetes Daughter     Past Surgical History:  Procedure Laterality Date   CESAREAN SECTION  07/24/1978   CESAREAN SECTION N/A    Phreesia 08/16/2020   COLON SURGERY     COLONOSCOPY     2013 polyps removed   COLONOSCOPY WITH PROPOFOL N/A 09/06/2015   Procedure: COLONOSCOPY WITH PROPOFOL;  Surgeon: Garlan Fair, MD;  Location: [facility];  Service: Endoscopy;  Laterality: N/A;   DILATION AND CURETTAGE OF UTERUS     ILEOSTOMY  07/24/2009   ILEOSTOMY CLOSURE  01/21/2010   left knee surgery     had screws and pins-does not bend at knee area   left knee surgery      removal of rectum  07/24/2009   TONSILLECTOMY     TUBAL LIGATION  07/24/1981    ROS: Review of Systems Negative  except as stated above  PHYSICAL EXAM: There were no vitals taken for this visit.  Physical Exam  {female adult master:310786} {female adult master:310785}  CMP Latest Ref Rng & Units 11/01/2020 08/05/2015 04/14/2014  Glucose 65 - 99 mg/dL 81 76 65(L)  BUN 8 - 27 mg/dL '9 10 12  '$ Creatinine 0.57 - 1.00 mg/dL 0.59 0.49(L) 0.63  Sodium 134 - 144 mmol/L 142 142 144  Potassium 3.5 - 5.2 mmol/L 4.5 3.9 4.3  Chloride 96 - 106 mmol/L 102 104 107  CO2 20 - 29 mmol/L '22 30 23  '$ Calcium 8.7 - 10.3 mg/dL 9.1 9.1 9.1  Total Protein 6.1 - 8.1 g/dL - 6.5 6.6  Total Bilirubin 0.2 - 1.2 mg/dL - 0.7 0.7  Alkaline Phos 33 - 130 U/L - 58 66  AST 10 - 35 U/L - 26 21  ALT 6 - 29 U/L - 13 10   Lipid Panel     Component Value Date/Time   CHOL 144 11/01/2020 1127   TRIG 79 11/01/2020 1127   HDL 70 11/01/2020 1127   CHOLHDL 2.1 11/01/2020 1127   CHOLHDL 2.9 04/14/2014 1103   VLDL 16 04/14/2014 1103   LDLCALC 59 11/01/2020 1127    CBC    Component Value Date/Time   WBC 6.0 08/05/2015 1550   RBC 4.26 08/05/2015 1550   HGB 13.5 08/05/2015 1550   HCT 40.7 08/05/2015 1550   PLT 194 08/05/2015 1550   MCV 95.5 08/05/2015 1550   MCH 31.7 08/05/2015 1550   MCHC 33.2 08/05/2015 1550   RDW 14.6 08/05/2015 1550   LYMPHSABS 1.0 04/14/2014 1103   MONOABS 0.2 04/14/2014 1103   EOSABS 0.1 04/14/2014 1103   BASOSABS 0.0 04/14/2014 1103    ASSESSMENT AND PLAN:  There are no diagnoses linked to this encounter.   Patient was given the opportunity to ask questions.  Patient verbalized understanding of the plan and was able to repeat key elements of the plan. Patient was given clear instructions to go to Emergency Department or return to medical center if symptoms don't improve, worsen, or new problems develop.The patient verbalized understanding.   No orders of the defined types were placed in this encounter.    Requested Prescriptions    No prescriptions requested or ordered in this encounter     No follow-ups on file.  Camillia Herter, NP

## 2021-04-13 ENCOUNTER — Ambulatory Visit: Payer: Medicare Other | Admitting: Family

## 2021-04-13 DIAGNOSIS — I10 Essential (primary) hypertension: Secondary | ICD-10-CM

## 2021-04-30 NOTE — Progress Notes (Deleted)
Patient ID: FRANCESS MULLEN, female    DOB: 12/17/1955  MRN: [id number]  CC: Hypertension Follow-Up   Subjective: Victoria Whitehead is a 65 y.o. female who presents for hypertension follow-up.   Her concerns today include:   HYPERTENSION FOLLOW-UP: 11/01/2020: - Continue Losartan as prescribed.   05/04/2021: Currently taking: see medication list Have you taken your blood pressure medication today: []  Yes []  No  Med Adherence: []  Yes    []  No Medication side effects: []  Yes    []  No Adherence with salt restriction (low-salt diet): []  Yes    []  No Exercise: Yes []  No []  Home Monitoring?: []  Yes    []  No Monitoring Frequency: []  Yes    []  No Home BP results range: []  Yes    []  No Smoking []  Yes []  No SOB? []  Yes    []  No Chest Pain?: []  Yes    []  No Leg swelling?: []  Yes    []  No Headaches?: []  Yes    []  No Dizziness? []  Yes    []  No Comments:    Patient Active Problem List   Diagnosis Date Noted   CAD seen on CT imaging 01/14/2021   Aortic atherosclerosis ([place]) 01/14/2021   Left-sided chest pain 01/05/2021   Centrilobular emphysema ([place]) 11/03/2020   Hyperlipidemia 08/24/2020   Chronic left flank pain 08/16/2015   Throat fullness 08/16/2015   Tobacco abuse 05/19/2014   Bee sting allergy 05/19/2014   History of rectal cancer 04/14/2014   Arthritis 04/14/2014   Chronic neck pain 04/14/2014   Osteoporosis 04/14/2014     Current Outpatient Medications on File Prior to Visit  Medication Sig Dispense Refill   albuterol (VENTOLIN HFA) 108 (90 Base) MCG/ACT inhaler Inhale 2 puffs into the lungs every 4 (four) hours as needed for wheezing or shortness of breath.     aspirin EC 81 MG tablet Take 1 tablet (81 mg total) by mouth daily. Swallow whole. 90 tablet 3   Glycopyrrolate-Formoterol (BEVESPI AEROSPHERE) 9-4.8 MCG/ACT AERO Inhale 2 Act into the lungs 2 (two) times daily. 10.7 g 11   loratadine (CLARITIN) 10 MG tablet Take 1 tablet by mouth once daily 30 tablet 0    losartan (COZAAR) 100 MG tablet Take 1 tablet by mouth once daily 90 tablet 0   nicotine polacrilex (NICORETTE MINI) 4 MG lozenge Take three times daily to quit smoking (Patient not taking: Reported on 03/07/2021) 100 tablet 4   rosuvastatin (CRESTOR) 20 MG tablet Take 20 mg by mouth daily.     No current facility-administered medications on file prior to visit.    Allergies  Allergen Reactions   Other Itching, Rash and Swelling   Bee Venom Itching, Swelling and Rash   Hydrogen Peroxide Rash   Neosporin [Neomycin-Bacitracin Zn-Polymyx] Rash   Zinc     Social History   Socioeconomic History   Marital status: Single    Spouse name: Not on file   Number of children: Not on file   Years of education: Not on file   Highest education level: Not on file  Occupational History   Occupation: Disabled  Tobacco Use   Smoking status: Every Day    Packs/day: 1.00    Years: 50.00    Pack years: 50.00    Types: Cigarettes   Smokeless tobacco: Never   Tobacco comments:    40 year smoker  Vaping Use   Vaping Use: Never used  Substance and Sexual Activity   Alcohol use:  No   Drug use: No   Sexual activity: Not Currently  Other Topics Concern   Not on file  Social History Narrative   Not on file   Social Determinants of Health   Financial Resource Strain: Not on file  Food Insecurity: Not on file  Transportation Needs: Not on file  Physical Activity: Not on file  Stress: Not on file  Social Connections: Not on file  Intimate Partner Violence: Not on file    Family History  Problem Relation Age of Onset   Heart disease Mother    COPD Mother    Cancer Sister        breast cancer    Hypertension Sister    Diabetes Maternal Grandmother    Heart disease Maternal Grandmother    Stroke Maternal Grandmother    Diabetes Daughter     Past Surgical History:  Procedure Laterality Date   CESAREAN SECTION  07/24/1978   CESAREAN SECTION N/A    Phreesia 08/16/2020   COLON  SURGERY     COLONOSCOPY     2013 polyps removed   COLONOSCOPY WITH PROPOFOL N/A 09/06/2015   Procedure: COLONOSCOPY WITH PROPOFOL;  Surgeon: Garlan Fair, MD;  Location: [facility];  Service: Endoscopy;  Laterality: N/A;   DILATION AND CURETTAGE OF UTERUS     ILEOSTOMY  07/24/2009   ILEOSTOMY CLOSURE  01/21/2010   left knee surgery     had screws and pins-does not bend at knee area   left knee surgery      removal of rectum  07/24/2009   TONSILLECTOMY     TUBAL LIGATION  07/24/1981    ROS: Review of Systems Negative except as stated above  PHYSICAL EXAM: There were no vitals taken for this visit.  Physical Exam  {female adult master:310786} {female adult master:310785}  CMP Latest Ref Rng & Units 11/01/2020 08/05/2015 04/14/2014  Glucose 65 - 99 mg/dL 81 76 65(L)  BUN 8 - 27 mg/dL 9 10 12   Creatinine 0.57 - 1.00 mg/dL 0.59 0.49(L) 0.63  Sodium 134 - 144 mmol/L 142 142 144  Potassium 3.5 - 5.2 mmol/L 4.5 3.9 4.3  Chloride 96 - 106 mmol/L 102 104 107  CO2 20 - 29 mmol/L 22 30 23   Calcium 8.7 - 10.3 mg/dL 9.1 9.1 9.1  Total Protein 6.1 - 8.1 g/dL - 6.5 6.6  Total Bilirubin 0.2 - 1.2 mg/dL - 0.7 0.7  Alkaline Phos 33 - 130 U/L - 58 66  AST 10 - 35 U/L - 26 21  ALT 6 - 29 U/L - 13 10   Lipid Panel     Component Value Date/Time   CHOL 144 11/01/2020 1127   TRIG 79 11/01/2020 1127   HDL 70 11/01/2020 1127   CHOLHDL 2.1 11/01/2020 1127   CHOLHDL 2.9 04/14/2014 1103   VLDL 16 04/14/2014 1103   LDLCALC 59 11/01/2020 1127    CBC    Component Value Date/Time   WBC 6.0 08/05/2015 1550   RBC 4.26 08/05/2015 1550   HGB 13.5 08/05/2015 1550   HCT 40.7 08/05/2015 1550   PLT 194 08/05/2015 1550   MCV 95.5 08/05/2015 1550   MCH 31.7 08/05/2015 1550   MCHC 33.2 08/05/2015 1550   RDW 14.6 08/05/2015 1550   LYMPHSABS 1.0 04/14/2014 1103   MONOABS 0.2 04/14/2014 1103   EOSABS 0.1 04/14/2014 1103   BASOSABS 0.0 04/14/2014 1103    ASSESSMENT AND PLAN:  There are no  diagnoses linked to this  encounter.   Patient was given the opportunity to ask questions.  Patient verbalized understanding of the plan and was able to repeat key elements of the plan. Patient was given clear instructions to go to Emergency Department or return to medical center if symptoms don't improve, worsen, or new problems develop.The patient verbalized understanding.   No orders of the defined types were placed in this encounter.    Requested Prescriptions    No prescriptions requested or ordered in this encounter    No follow-ups on file.  Camillia Herter, NP

## 2021-05-04 ENCOUNTER — Ambulatory Visit: Payer: Medicare Other | Admitting: Family

## 2021-05-04 DIAGNOSIS — I10 Essential (primary) hypertension: Secondary | ICD-10-CM

## 2021-05-11 ENCOUNTER — Telehealth: Payer: Self-pay | Admitting: Family

## 2021-05-11 DIAGNOSIS — J3089 Other allergic rhinitis: Secondary | ICD-10-CM

## 2021-05-11 DIAGNOSIS — I10 Essential (primary) hypertension: Secondary | ICD-10-CM

## 2021-05-11 NOTE — Telephone Encounter (Signed)
Pt requesting EPI PEN/ B-sting Kit Refill  loratadine (CLARITIN) 10 MG tablet [[id number]]   losartan (COZAAR) 100 MG tablet [[id number]]   rosuvastatin (CRESTOR) 20 MG tablet [[id number]]   NEW PHARMACY: [facility]  [address] [place], [place] [address]  [phone]

## 2021-05-12 MED ORDER — LOSARTAN POTASSIUM 100 MG PO TABS
100.0000 mg | ORAL_TABLET | Freq: Every day | ORAL | 0 refills | Status: DC
Start: 2021-05-12 — End: 2021-05-27

## 2021-05-12 MED ORDER — ROSUVASTATIN CALCIUM 20 MG PO TABS: 20.0000 mg | ORAL_TABLET | Freq: Every day | ORAL | 2 refills | Status: DC

## 2021-05-12 MED ORDER — LORATADINE 10 MG PO TABS
10.0000 mg | ORAL_TABLET | Freq: Every day | ORAL | 0 refills | Status: DC
Start: 2021-05-12 — End: 2021-06-23

## 2021-05-13 ENCOUNTER — Telehealth: Payer: Self-pay | Admitting: Family

## 2021-05-13 NOTE — Telephone Encounter (Signed)
  Pt calling again requesting EPI PEN/ B-sting Kit Refill. Pt states without this she will die. Pt  needs assistance in changing  Preferred Pharmacy in Chart. *[facility]  [address] [place], [place] [address]   [phone]   Please advise and thank you

## 2021-05-18 ENCOUNTER — Other Ambulatory Visit: Payer: Self-pay | Admitting: Family

## 2021-05-18 DIAGNOSIS — T782XXA Anaphylactic shock, unspecified, initial encounter: Secondary | ICD-10-CM

## 2021-05-18 MED ORDER — EPINEPHRINE 0.3 MG/0.3ML IJ SOAJ: 0.3000 mg | INTRAMUSCULAR | 2 refills | Status: DC | PRN

## 2021-05-18 NOTE — Telephone Encounter (Signed)
Per patient request Epinephrine refilled and sent to requested pharmacy.

## 2021-05-18 NOTE — Telephone Encounter (Signed)
This patient is needing a epi pen. I don't see one on current med list. Can you assist.

## 2021-05-18 NOTE — Telephone Encounter (Signed)
Called pt made aware of Rx  send to pharmacy

## 2021-05-26 NOTE — Progress Notes (Signed)
Patient ID: JASREET DICKIE, female    DOB: 1956-03-19  MRN: [id number]  CC: Hypertension Follow-Up  Subjective: Victoria Whitehead is a 65 y.o. female who presents for hypertension follow-up.   Her concerns today include:  HYPERTENSION  FOLLOW-UP: 11/01/2020: - Continue Losartan as prescribed.   05/27/2021: Doing well on current regimen. No side effects. No issues/concerns. Denies chest pain and shortness of breath.   2. COPD: Requesting refills of Albuterol. No issues/concerns. Established with Pulmonology.   3. HISTORY OF RECTAL CANCER: 4. DIARRHEA: Requesting Loperamide be sent in as a prescription. Currently purchasing over-the-counter but prefers prescription.  Reports medically cleared from Oncology years ago after rectal cancer. Has tried to see Gastroenterology while living in [place] and did not have a good patient experience.  Reports last colonoscopy was at least 1 year ago while in [place]. 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  5. INSOMNIA: Reports more since her mother passed away in 10/11/2019. Goes to bed around 8 pm and awake for good at 4 am or 5 am. Waking up at least 3 to 4 times each night to urinate. Denies urinary incontinence. Endorses bowel incontinence sometimes related to history of rectal cancer/diarrhea, wearing adult briefs helps with this. Thinking of trying over-the-counter Melatonin.   6. OSTEOPOROSIS: Reports history of severe osteoporosis in 10/11/98 (diagnosed while in [place]). When moved to [place] in October 10, 2013 had repeat bone density and still with severe osteoporosis. Reports on both occasions she received a once yearly Reclast infusion because she was unable to tolerate Fosamax. Reports Reclast was given by her oncologist. Taking vitamin D supplements.    Patient Active Problem List   Diagnosis Date Noted   Age-related osteoporosis without current pathological fracture 05/27/2021   COPD (chronic  obstructive pulmonary disease) ([place]) 05/27/2021   CAD seen on CT imaging 01/14/2021   Aortic atherosclerosis ([place]) 01/14/2021   Left-sided chest pain 01/05/2021   Centrilobular emphysema ([place]) 11/03/2020   Hyperlipidemia 08/24/2020   Chronic left flank pain 08/16/2015   Throat fullness 08/16/2015   Tobacco abuse 05/19/2014   Bee sting allergy 05/19/2014   History of rectal cancer 04/14/2014   Arthritis 04/14/2014   Chronic neck pain 04/14/2014   Osteoporosis 04/14/2014     Current Outpatient Medications on File Prior to Visit  Medication Sig Dispense Refill   aspirin EC 81 MG tablet Take 1 tablet (81 mg total) by mouth daily. Swallow whole. 90 tablet 3   EPINEPHrine 0.3 mg/0.3 mL IJ SOAJ injection Inject 0.3 mg into the muscle as needed for anaphylaxis. 1 each 2   Glycopyrrolate-Formoterol (BEVESPI AEROSPHERE) 9-4.8 MCG/ACT AERO Inhale 2 Act into the lungs 2 (two) times daily. 10.7 g 11   loratadine (CLARITIN) 10 MG tablet Take 1 tablet (10 mg total) by mouth daily. 30 tablet 0   nicotine polacrilex (NICORETTE MINI) 4 MG lozenge Take three times daily to quit smoking (Patient not taking: No sig reported) 100 tablet 4   rosuvastatin (CRESTOR) 20 MG tablet Take 1 tablet (20 mg total) by mouth daily. 90 tablet 2   No current facility-administered medications on file prior to visit.    Allergies  Allergen Reactions   Other Itching, Rash and Swelling   Bee Venom Itching, Swelling and Rash   Hydrogen Peroxide Rash   Neosporin [Neomycin-Bacitracin Zn-Polymyx] Rash   Zinc     Social History   Socioeconomic History   Marital status: Single    Spouse  name: Not on file   Number of children: Not on file   Years of education: Not on file   Highest education level: Not on file  Occupational History   Occupation: Disabled  Tobacco Use   Smoking status: Every Day    Packs/day: 1.00    Years: 50.00    Pack years: 50.00    Types: Cigarettes   Smokeless tobacco: Never   Tobacco  comments:    40 year smoker  Vaping Use   Vaping Use: Never used  Substance and Sexual Activity   Alcohol use: No   Drug use: No   Sexual activity: Not Currently  Other Topics Concern   Not on file  Social History Narrative   Not on file   Social Determinants of Health   Financial Resource Strain: Not on file  Food Insecurity: Not on file  Transportation Needs: Not on file  Physical Activity: Not on file  Stress: Not on file  Social Connections: Not on file  Intimate Partner Violence: Not on file    Family History  Problem Relation Age of Onset   Heart disease Mother    COPD Mother    Cancer Sister        breast cancer    Hypertension Sister    Diabetes Maternal Grandmother    Heart disease Maternal Grandmother    Stroke Maternal Grandmother    Diabetes Daughter     Past Surgical History:  Procedure Laterality Date   CESAREAN SECTION  07/24/1978   CESAREAN SECTION N/A    [employer]reesia 08/16/2020   COLON SURGERY     COLONOSCOPY     2013 polyps removed   COLONOSCOPY WITH PROPOFOL N/A 09/06/2015   Procedure: COLONOSCOPY WITH PROPOFOL;  Surgeon: Garlan Fair, MD;  Location: [facility];  Service: Endoscopy;  Laterality: N/A;   DILATION AND CURETTAGE OF UTERUS     ILEOSTOMY  07/24/2009   ILEOSTOMY CLOSURE  01/21/2010   left knee surgery     had screws and pins-does not bend at knee area   left knee surgery      removal of rectum  07/24/2009   TONSILLECTOMY     TUBAL LIGATION  07/24/1981    ROS: Review of Systems Negative except as stated above  PHYSICAL EXAM: BP 122/77 (BP Location: Left Arm, Patient Position: Sitting, Cuff Size: Normal)   Pulse 87   Temp 98.5 F (36.9 C)   Resp 15   Ht 5' 2.01" (1.575 m)   Wt 140 lb (63.5 kg)   SpO2 95%   BMI 25.60 kg/m   Physical Exam HENT:     Head: Normocephalic and atraumatic.  Eyes:     Extraocular Movements: Extraocular movements intact.     Conjunctiva/sclera: Conjunctivae normal.     Pupils: Pupils  are equal, round, and reactive to light.  Cardiovascular:     Rate and Rhythm: Normal rate and regular rhythm.     Pulses: Normal pulses.     Heart sounds: Normal heart sounds.  Pulmonary:     Effort: Pulmonary effort is normal.     Breath sounds: Normal breath sounds.  Musculoskeletal:     Cervical back: Normal range of motion and neck supple.  Neurological:     General: No focal deficit present.     Mental Status: She is alert and oriented to person, place, and time.  Psychiatric:        Mood and Affect: Mood normal.  Behavior: Behavior normal.   ASSESSMENT AND PLAN: 1. Essential hypertension: - Continue Losartan as prescribed.  - Counseled on blood pressure goal of less than 140/90, low-sodium, DASH diet, medication compliance, 150 minutes of moderate intensity exercise per week as tolerated. Discussed medication compliance, adverse effects. - BMP to evaluate kidney function and electrolyte balance. - Follow-up with primary provider in 3 months or sooner if needed. - Basic Metabolic Panel - losartan (COZAAR) 100 MG tablet; Take 1 tablet (100 mg total) by mouth daily.  Dispense: 90 tablet; Refill: 0  2. Chronic obstructive pulmonary disease, unspecified COPD type ([place]): - Continue current regimen.  - Keep all scheduled appointments with Pulmonology.  - albuterol (VENTOLIN HFA) 108 (90 Base) MCG/ACT inhaler; Inhale 2 puffs into the lungs every 4 (four) hours as needed for wheezing or shortness of breath.  Dispense: 18 g; Refill: 1  3. History of rectal cancer: 4. Diarrhea, unspecified type: - Continue Loperamide as prescribed.  - Referral to Gastroenterology for further evaluation and management.  - loperamide (IMODIUM A-D) 2 MG tablet; Take 1 tablet (2 mg total) by mouth daily as needed for diarrhea or loose stools.  Dispense: 120 tablet; Refill: 0 - Ambulatory referral to Gastroenterology  5. Insomnia, unspecified type: 6. Frequent urination at night: - Patient  prefers to trial course of over-the-counter medication as of present for insomnia.  - Patient declined referral to Urogynecology for further evaluation and management.  - Follow-up with primary provider as scheduled.   7. Osteoporosis, unspecified osteoporosis type, unspecified pathological fracture presence: - Counseled on recommended repeat screening. Counseled to let primary provider know if she has any concerns prior to that time, patient agreeable.   Patient was given the opportunity to ask questions.  Patient verbalized understanding of the plan and was able to repeat key elements of the plan. Patient was given clear instructions to go to Emergency Department or return to medical center if symptoms don't improve, worsen, or new problems develop.The patient verbalized understanding.   Orders Placed This Encounter  Procedures   Basic Metabolic Panel   Ambulatory referral to Gastroenterology    Requested Prescriptions   Signed Prescriptions Disp Refills   loperamide (IMODIUM A-D) 2 MG tablet 120 tablet 0    Sig: Take 1 tablet (2 mg total) by mouth daily as needed for diarrhea or loose stools.   albuterol (VENTOLIN HFA) 108 (90 Base) MCG/ACT inhaler 18 g 1    Sig: Inhale 2 puffs into the lungs every 4 (four) hours as needed for wheezing or shortness of breath.   losartan (COZAAR) 100 MG tablet 90 tablet 0    Sig: Take 1 tablet (100 mg total) by mouth daily.    Return in about 3 months (around 08/27/2021) for Follow-Up or next available hypertension .  Victoria Herter, NP

## 2021-05-27 ENCOUNTER — Other Ambulatory Visit: Payer: Self-pay

## 2021-05-27 ENCOUNTER — Encounter (INDEPENDENT_AMBULATORY_CARE_PROVIDER_SITE_OTHER): Payer: Self-pay

## 2021-05-27 ENCOUNTER — Ambulatory Visit (INDEPENDENT_AMBULATORY_CARE_PROVIDER_SITE_OTHER): Payer: Medicare Other | Admitting: Family

## 2021-05-27 ENCOUNTER — Encounter: Payer: Self-pay | Admitting: Family

## 2021-05-27 VITALS — BP 122/77 | HR 87 | Temp 98.5°F | Resp 15 | Ht 62.01 in | Wt 140.0 lb

## 2021-05-27 DIAGNOSIS — R197 Diarrhea, unspecified: Secondary | ICD-10-CM | POA: Diagnosis not present

## 2021-05-27 DIAGNOSIS — J449 Chronic obstructive pulmonary disease, unspecified: Secondary | ICD-10-CM | POA: Diagnosis not present

## 2021-05-27 DIAGNOSIS — G4700 Insomnia, unspecified: Secondary | ICD-10-CM | POA: Diagnosis not present

## 2021-05-27 DIAGNOSIS — R351 Nocturia: Secondary | ICD-10-CM

## 2021-05-27 DIAGNOSIS — J439 Emphysema, unspecified: Secondary | ICD-10-CM | POA: Insufficient documentation

## 2021-05-27 DIAGNOSIS — I10 Essential (primary) hypertension: Secondary | ICD-10-CM | POA: Diagnosis not present

## 2021-05-27 DIAGNOSIS — Z85048 Personal history of other malignant neoplasm of rectum, rectosigmoid junction, and anus: Secondary | ICD-10-CM

## 2021-05-27 DIAGNOSIS — M810 Age-related osteoporosis without current pathological fracture: Secondary | ICD-10-CM

## 2021-05-27 HISTORY — DX: Age-related osteoporosis without current pathological fracture: M81.0

## 2021-05-27 MED ORDER — ALBUTEROL SULFATE HFA 108 (90 BASE) MCG/ACT IN AERS: 2.0000 | INHALATION_SPRAY | RESPIRATORY_TRACT | 1 refills | Status: DC | PRN

## 2021-05-27 MED ORDER — LOPERAMIDE HCL 2 MG PO TABS: 2.0000 mg | ORAL_TABLET | Freq: Every day | ORAL | 0 refills | Status: DC | PRN

## 2021-05-27 MED ORDER — LOSARTAN POTASSIUM 100 MG PO TABS: 100.0000 mg | ORAL_TABLET | Freq: Every day | ORAL | 0 refills | Status: DC

## 2021-05-27 NOTE — Progress Notes (Signed)
Pt presents for hypertension follow-up, , wants to discuss bone density because was diagnosed w/osteoporosis in 2000, complaints of insomnia  Pt states has been taking loperamide but prefers to see if insurance will pay for it   Needs refill on albuterol inhaler

## 2021-05-28 LAB — BASIC METABOLIC PANEL
BUN/Creatinine Ratio: 19 (ref 12–28)
BUN: 11 mg/dL (ref 8–27)
CO2: 27 mmol/L (ref 20–29)
Calcium: 9.3 mg/dL (ref 8.7–10.3)
Chloride: 105 mmol/L (ref 96–106)
Creatinine, Ser: 0.57 mg/dL (ref 0.57–1.00)
Glucose: 86 mg/dL (ref 70–99)
Potassium: 4.1 mmol/L (ref 3.5–5.2)
Sodium: 145 mmol/L — ABNORMAL HIGH (ref 134–144)
eGFR: 101 mL/min/{1.73_m2} (ref >59)

## 2021-05-28 NOTE — Progress Notes (Signed)
Please call patient with update.   Kidney function normal.

## 2021-06-02 ENCOUNTER — Other Ambulatory Visit: Payer: Self-pay

## 2021-06-02 ENCOUNTER — Telehealth: Payer: Self-pay

## 2021-06-02 DIAGNOSIS — Z85048 Personal history of other malignant neoplasm of rectum, rectosigmoid junction, and anus: Secondary | ICD-10-CM

## 2021-06-02 DIAGNOSIS — R197 Diarrhea, unspecified: Secondary | ICD-10-CM

## 2021-06-02 DIAGNOSIS — J449 Chronic obstructive pulmonary disease, unspecified: Secondary | ICD-10-CM

## 2021-06-02 DIAGNOSIS — I10 Essential (primary) hypertension: Secondary | ICD-10-CM

## 2021-06-02 MED ORDER — LOSARTAN POTASSIUM 100 MG PO TABS: 100.0000 mg | ORAL_TABLET | Freq: Every day | ORAL | 0 refills | Status: DC

## 2021-06-02 MED ORDER — LOPERAMIDE HCL 2 MG PO TABS: 2.0000 mg | ORAL_TABLET | Freq: Every day | ORAL | 0 refills | Status: AC | PRN

## 2021-06-02 MED ORDER — ALBUTEROL SULFATE HFA 108 (90 BASE) MCG/ACT IN AERS: 2.0000 | INHALATION_SPRAY | RESPIRATORY_TRACT | 1 refills | Status: AC | PRN

## 2021-06-02 NOTE — Progress Notes (Signed)
Pt rx reordered sent to [facility] in [place]

## 2021-06-02 NOTE — Telephone Encounter (Signed)
Pt called in to discuss her lab results from 05/27/21  Pt is also requesting that her pharmacy be changed to the [facility] on [address] in [place], [place]

## 2021-06-02 NOTE — Telephone Encounter (Signed)
Att to contact pt to advise of labs no ans

## 2021-06-07 ENCOUNTER — Other Ambulatory Visit: Payer: Self-pay | Admitting: Family

## 2021-06-07 DIAGNOSIS — Z85048 Personal history of other malignant neoplasm of rectum, rectosigmoid junction, and anus: Secondary | ICD-10-CM

## 2021-06-07 DIAGNOSIS — R197 Diarrhea, unspecified: Secondary | ICD-10-CM

## 2021-06-23 ENCOUNTER — Other Ambulatory Visit: Payer: Self-pay | Admitting: Family

## 2021-06-23 DIAGNOSIS — J3089 Other allergic rhinitis: Secondary | ICD-10-CM

## 2021-07-18 ENCOUNTER — Other Ambulatory Visit: Payer: Self-pay | Admitting: Family

## 2021-07-18 DIAGNOSIS — J3089 Other allergic rhinitis: Secondary | ICD-10-CM

## 2021-07-21 ENCOUNTER — Other Ambulatory Visit: Payer: Self-pay | Admitting: Family

## 2021-07-21 DIAGNOSIS — J3089 Other allergic rhinitis: Secondary | ICD-10-CM

## 2021-08-23 ENCOUNTER — Other Ambulatory Visit: Payer: Self-pay | Admitting: Family

## 2021-08-23 DIAGNOSIS — J3089 Other allergic rhinitis: Secondary | ICD-10-CM

## 2021-09-15 ENCOUNTER — Other Ambulatory Visit: Payer: Self-pay | Admitting: Family

## 2021-09-15 DIAGNOSIS — I10 Essential (primary) hypertension: Secondary | ICD-10-CM

## 2021-09-17 NOTE — Progress Notes (Signed)
Patient ID: Victoria Whitehead, female    DOB: 10/28/1955  MRN: [id number]  CC: Hypertension Follow-Up  Subjective: Victoria Whitehead is a 66 y.o. female who presents for hypertension follow-up.   Her concerns today include:   HYPERTENSION FOLLOW-UP: 05/27/2021: - Continue Losartan as prescribed.   09/21/2021: Doing well on current regimen. No side effects. No issues/concerns. Denies chest pain and shortness of breath.   2. HYPERLIPIDEMIA FOLLOW-UP: Endorses leg cramping and soreness. Considered beginning over-the-counter supplement CoQ10 that has been marketed to lower cholesterol and blood pressure. Plans to establish with a new primary care at the end of March 2023. Reports changing to new primary care related to getting a new health insurance plan.   3. ALLERGY FOLLOW-UP: Doing well on current Loratadine, no issues/concerns.   4. FREQUENT URINATION: Persisting for months. Not resting well at night, unsure of the cause. Family history of the same. Thinks may be related to frequent urination. Also, considered if could be related to cholesterol medication because symptoms began after starting the same. Declines urinalysis today.   5. LYMPH NODES CONCERNS: Reports had belongings in storage for 8 years. Recently 5 months ago removed items from the same. Subsequently noticed lymph nodes bilateral neck present. Has decreased in size since then. Denies pain, difficulty swallowing, neck swelling and additional red flag symotoms.     Patient Active Problem List   Diagnosis Date Noted   Age-related osteoporosis without current pathological fracture 05/27/2021   COPD (chronic obstructive pulmonary disease) ([place]) 05/27/2021   CAD seen on CT imaging 01/14/2021   Aortic atherosclerosis ([place]) 01/14/2021   Left-sided chest pain 01/05/2021   Centrilobular emphysema ([place]) 11/03/2020   Hyperlipidemia 08/24/2020   Chronic left flank pain 08/16/2015   Throat fullness 08/16/2015   Tobacco abuse  05/19/2014   Bee sting allergy 05/19/2014   History of rectal cancer 04/14/2014   Arthritis 04/14/2014   Chronic neck pain 04/14/2014   Osteoporosis 04/14/2014     Current Outpatient Medications on File Prior to Visit  Medication Sig Dispense Refill   albuterol (VENTOLIN HFA) 108 (90 Base) MCG/ACT inhaler Inhale 2 puffs into the lungs every 4 (four) hours as needed for wheezing or shortness of breath. 18 g 1   aspirin EC 81 MG tablet Take 1 tablet (81 mg total) by mouth daily. Swallow whole. 90 tablet 3   EPINEPHrine 0.3 mg/0.3 mL IJ SOAJ injection Inject 0.3 mg into the muscle as needed for anaphylaxis. 1 each 2   Glycopyrrolate-Formoterol (BEVESPI AEROSPHERE) 9-4.8 MCG/ACT AERO Inhale 2 Act into the lungs 2 (two) times daily. 10.7 g 11   loperamide (IMODIUM A-D) 2 MG tablet Take 1 tablet (2 mg total) by mouth daily as needed for diarrhea or loose stools. 120 tablet 0   losartan (COZAAR) 100 MG tablet Take 1 tablet by mouth once daily 90 tablet 0   nicotine polacrilex (NICORETTE MINI) 4 MG lozenge Take three times daily to quit smoking (Patient not taking: No sig reported) 100 tablet 4   rosuvastatin (CRESTOR) 20 MG tablet Take 1 tablet (20 mg total) by mouth daily. 90 tablet 2   No current facility-administered medications on file prior to visit.    Allergies  Allergen Reactions   Other Itching, Rash and Swelling   Bee Venom Itching, Swelling and Rash   Hydrogen Peroxide Rash   Neosporin [Neomycin-Bacitracin Zn-Polymyx] Rash   Zinc     Social History   Socioeconomic History   Marital status: Single  Spouse name: Not on file   Number of children: Not on file   Years of education: Not on file   Highest education level: Not on file  Occupational History   Occupation: Disabled  Tobacco Use   Smoking status: Every Day    Packs/day: 1.00    Years: 50.00    Pack years: 50.00    Types: Cigarettes   Smokeless tobacco: Never   Tobacco comments:    40 year smoker  Vaping  Use   Vaping Use: Never used  Substance and Sexual Activity   Alcohol use: No   Drug use: No   Sexual activity: Not Currently  Other Topics Concern   Not on file  Social History Narrative   Not on file   Social Determinants of Health   Financial Resource Strain: Not on file  Food Insecurity: Not on file  Transportation Needs: Not on file  Physical Activity: Not on file  Stress: Not on file  Social Connections: Not on file  Intimate Partner Violence: Not on file    Family History  Problem Relation Age of Onset   Heart disease Mother    COPD Mother    Cancer Sister        breast cancer    Hypertension Sister    Diabetes Maternal Grandmother    Heart disease Maternal Grandmother    Stroke Maternal Grandmother    Diabetes Daughter     Past Surgical History:  Procedure Laterality Date   CESAREAN SECTION  07/24/1978   CESAREAN SECTION N/A    [employer] 08/16/2020   COLON SURGERY     COLONOSCOPY     2013 polyps removed   COLONOSCOPY WITH PROPOFOL N/A 09/06/2015   Procedure: COLONOSCOPY WITH PROPOFOL;  Surgeon: Garlan Fair, MD;  Location: [facility];  Service: Endoscopy;  Laterality: N/A;   DILATION AND CURETTAGE OF UTERUS     ILEOSTOMY  07/24/2009   ILEOSTOMY CLOSURE  01/21/2010   left knee surgery     had screws and pins-does not bend at knee area   left knee surgery      removal of rectum  07/24/2009   TONSILLECTOMY     TUBAL LIGATION  07/24/1981    ROS: Review of Systems Negative except as stated above  PHYSICAL EXAM: BP 118/74 (BP Location: Left Arm, Patient Position: Sitting, Cuff Size: Normal)    Pulse 77    Temp 98.3 F (36.8 C)    Resp 18    Ht 5' 2.01" (1.575 m)    Wt 147 lb (66.7 kg)    SpO2 98%    BMI 26.88 kg/m   Physical Exam HENT:     Head: Normocephalic and atraumatic.  Eyes:     Extraocular Movements: Extraocular movements intact.     Conjunctiva/sclera: Conjunctivae normal.     Pupils: Pupils are equal, round, and reactive to  light.  Cardiovascular:     Rate and Rhythm: Normal rate and regular rhythm.     Pulses: Normal pulses.     Heart sounds: Normal heart sounds.  Pulmonary:     Effort: Pulmonary effort is normal.     Breath sounds: Normal breath sounds.  Musculoskeletal:     Cervical back: Normal range of motion and neck supple.  Neurological:     General: No focal deficit present.     Mental Status: She is alert and oriented to person, place, and time.  Psychiatric:        Mood and  Affect: Mood normal.        Behavior: Behavior normal.    ASSESSMENT AND PLAN: 1. Essential (primary) hypertension: - Continue Losartan as prescribed. No refills needed as of present. - Counseled on blood pressure goal of less than 140/90, low-sodium, DASH diet, medication compliance, 150 minutes of moderate intensity exercise per week as tolerated. Discussed medication compliance, adverse effects. - Follow-up with primary provider in 3 months or sooner if needed.  2. Hyperlipidemia, unspecified hyperlipidemia type: - Decrease Rosuvastatin from 20 mg daily to 10 mg daily to see if leg cramps improve.  - Recheck fasting cholesterol in 4 to 6 weeks.  - Patient reports planning to establish with new primary care later this month related to new health insurance plan.  3. Perennial allergic rhinitis: - Continue Loratadine as prescribed.  - Follow-up with primary provider as scheduled. - loratadine (CLARITIN) 10 MG tablet; Take 1 tablet (10 mg total) by mouth daily.  Dispense: 90 tablet; Refill: 0  4. Frequent urination: - Patient declined urinalysis.  - Referral to Urogynecology for further evaluation and management.  - Ambulatory referral to Urogynecology  5. Thyroid disorder screen: - TSH to check thyroid function.  - TSH    Patient was given the opportunity to ask questions.  Patient verbalized understanding of the plan and was able to repeat key elements of the plan. Patient was given clear instructions to go to  Emergency Department or return to medical center if symptoms don't improve, worsen, or new problems develop.The patient verbalized understanding.   Orders Placed This Encounter  Procedures   TSH   Ambulatory referral to Urogynecology     Requested Prescriptions   Signed Prescriptions Disp Refills   loratadine (CLARITIN) 10 MG tablet 90 tablet 0    Sig: Take 1 tablet (10 mg total) by mouth daily.    Return in about 3 months (around 12/22/2021) for Follow-Up or next available hypertension .  Camillia Herter, NP

## 2021-09-21 ENCOUNTER — Encounter (INDEPENDENT_AMBULATORY_CARE_PROVIDER_SITE_OTHER): Payer: Self-pay

## 2021-09-21 ENCOUNTER — Ambulatory Visit (INDEPENDENT_AMBULATORY_CARE_PROVIDER_SITE_OTHER): Payer: PPO | Admitting: Family

## 2021-09-21 ENCOUNTER — Encounter: Payer: Self-pay | Admitting: Family

## 2021-09-21 VITALS — BP 118/74 | HR 77 | Temp 98.3°F | Resp 18 | Ht 62.01 in | Wt 147.0 lb

## 2021-09-21 DIAGNOSIS — I10 Essential (primary) hypertension: Secondary | ICD-10-CM | POA: Diagnosis not present

## 2021-09-21 DIAGNOSIS — Z1329 Encounter for screening for other suspected endocrine disorder: Secondary | ICD-10-CM | POA: Diagnosis not present

## 2021-09-21 DIAGNOSIS — R350 Frequency of micturition: Secondary | ICD-10-CM

## 2021-09-21 DIAGNOSIS — J3089 Other allergic rhinitis: Secondary | ICD-10-CM | POA: Diagnosis not present

## 2021-09-21 DIAGNOSIS — E785 Hyperlipidemia, unspecified: Secondary | ICD-10-CM

## 2021-09-21 MED ORDER — LORATADINE 10 MG PO TABS: 10.0000 mg | ORAL_TABLET | Freq: Every day | ORAL | 0 refills | Status: DC

## 2021-09-21 NOTE — Progress Notes (Signed)
Pt presents for hypertension follow-up, pt states feels like lymph nodes are swollen  ?

## 2021-09-22 LAB — TSH: TSH: 4.74 u[IU]/mL — ABNORMAL HIGH (ref 0.450–4.500)

## 2021-09-22 NOTE — Progress Notes (Signed)
Call patient with update.  ? ?Thyroid function mildly higher than normal. Encouraged to recheck in 4 to 6 weeks. Patient reported she plans to establish with new primary care later this month. She should make him/her aware of importance of rechecking thyroid function at that time.

## 2021-09-28 ENCOUNTER — Telehealth: Payer: Self-pay | Admitting: Family

## 2021-09-28 MED ORDER — BUDESONIDE-FORMOTEROL FUMARATE 160-4.5 MCG/ACT IN AERO: 2.0000 | INHALATION_SPRAY | Freq: Two times a day (BID) | RESPIRATORY_TRACT | 3 refills | Status: DC

## 2021-09-28 NOTE — Telephone Encounter (Signed)
Copied from [place] [id number]. Topic: General - Other ?>> Sep 28, 2021 10:45 AM Leward Quan A wrote: ?Reason for CRM: Patient called in to inform Dr Joya Gaskins that [employer] faxed over a request on Glycopyrrolate-Formoterol (BEVESPI AEROSPHERE) 9-4.8 MCG/ACT AERO that was denied and not sure why since he was the one that prescribed this medication.   per patient for this medication the pharmacy  want $500 out of pocket from patient in order to fill. Fraser Din States that these are some suggested Rx that can ber prescribed in place of original if Dr Joya Gaskins see its ok with him ( Enoro Ellipta, Combivent, Wixela, Symbicort, Stiolto Respimat,  Incrse Ellipta and Brio Ellipta would be a $45 copay monthly )  She is asking if Dr Joya Gaskins to please call Ph# [phone] ?

## 2021-09-28 NOTE — Telephone Encounter (Signed)
Patient aware of message per Dr. Joya Gaskins.  ?

## 2021-09-28 NOTE — Telephone Encounter (Signed)
None of these will be as effective and would be lower copay   I will order the symbicort ?

## 2021-10-19 DIAGNOSIS — Z79899 Other long term (current) drug therapy: Secondary | ICD-10-CM | POA: Diagnosis not present

## 2021-10-19 DIAGNOSIS — Z1231 Encounter for screening mammogram for malignant neoplasm of breast: Secondary | ICD-10-CM | POA: Diagnosis not present

## 2021-10-19 DIAGNOSIS — J449 Chronic obstructive pulmonary disease, unspecified: Secondary | ICD-10-CM | POA: Diagnosis not present

## 2021-10-19 DIAGNOSIS — R7989 Other specified abnormal findings of blood chemistry: Secondary | ICD-10-CM | POA: Diagnosis not present

## 2021-10-19 DIAGNOSIS — E785 Hyperlipidemia, unspecified: Secondary | ICD-10-CM | POA: Diagnosis not present

## 2021-10-19 DIAGNOSIS — I10 Essential (primary) hypertension: Secondary | ICD-10-CM | POA: Diagnosis not present

## 2022-01-17 ENCOUNTER — Telehealth: Payer: Self-pay

## 2022-01-17 ENCOUNTER — Other Ambulatory Visit: Payer: Self-pay | Admitting: Critical Care Medicine

## 2022-01-18 DIAGNOSIS — Z87891 Personal history of nicotine dependence: Secondary | ICD-10-CM | POA: Diagnosis not present

## 2022-01-18 DIAGNOSIS — J449 Chronic obstructive pulmonary disease, unspecified: Secondary | ICD-10-CM | POA: Diagnosis not present

## 2022-01-18 DIAGNOSIS — Z1231 Encounter for screening mammogram for malignant neoplasm of breast: Secondary | ICD-10-CM | POA: Diagnosis not present

## 2022-01-18 DIAGNOSIS — Z79899 Other long term (current) drug therapy: Secondary | ICD-10-CM | POA: Diagnosis not present

## 2022-01-18 DIAGNOSIS — I10 Essential (primary) hypertension: Secondary | ICD-10-CM | POA: Diagnosis not present

## 2022-01-18 DIAGNOSIS — Z6828 Body mass index (BMI) 28.0-28.9, adult: Secondary | ICD-10-CM | POA: Diagnosis not present

## 2022-01-18 DIAGNOSIS — E785 Hyperlipidemia, unspecified: Secondary | ICD-10-CM | POA: Diagnosis not present

## 2022-01-18 DIAGNOSIS — Z9181 History of falling: Secondary | ICD-10-CM | POA: Diagnosis not present

## 2022-01-19 NOTE — Telephone Encounter (Signed)
Please see notes and send to appropriate person for scheduling patient with Asencion Noble, MD at [facility].

## 2022-01-27 DIAGNOSIS — Z122 Encounter for screening for malignant neoplasm of respiratory organs: Secondary | ICD-10-CM | POA: Diagnosis not present

## 2022-01-27 DIAGNOSIS — Z87891 Personal history of nicotine dependence: Secondary | ICD-10-CM | POA: Diagnosis not present

## 2022-01-27 DIAGNOSIS — F17210 Nicotine dependence, cigarettes, uncomplicated: Secondary | ICD-10-CM | POA: Diagnosis not present

## 2022-02-17 DIAGNOSIS — R109 Unspecified abdominal pain: Secondary | ICD-10-CM | POA: Diagnosis not present

## 2022-02-17 DIAGNOSIS — K769 Liver disease, unspecified: Secondary | ICD-10-CM | POA: Diagnosis not present

## 2022-02-17 DIAGNOSIS — K7689 Other specified diseases of liver: Secondary | ICD-10-CM | POA: Diagnosis not present

## 2022-02-17 DIAGNOSIS — R160 Hepatomegaly, not elsewhere classified: Secondary | ICD-10-CM | POA: Diagnosis not present

## 2022-02-21 DIAGNOSIS — E785 Hyperlipidemia, unspecified: Secondary | ICD-10-CM | POA: Diagnosis not present

## 2022-02-21 DIAGNOSIS — Z0000 Encounter for general adult medical examination without abnormal findings: Secondary | ICD-10-CM | POA: Diagnosis not present

## 2022-02-21 DIAGNOSIS — Z9181 History of falling: Secondary | ICD-10-CM | POA: Diagnosis not present

## 2022-02-21 DIAGNOSIS — Z1331 Encounter for screening for depression: Secondary | ICD-10-CM | POA: Diagnosis not present

## 2022-02-21 DIAGNOSIS — Z139 Encounter for screening, unspecified: Secondary | ICD-10-CM | POA: Diagnosis not present

## 2022-02-22 DIAGNOSIS — Z1231 Encounter for screening mammogram for malignant neoplasm of breast: Secondary | ICD-10-CM | POA: Diagnosis not present

## 2022-07-20 DIAGNOSIS — E785 Hyperlipidemia, unspecified: Secondary | ICD-10-CM | POA: Diagnosis not present

## 2022-07-20 DIAGNOSIS — J449 Chronic obstructive pulmonary disease, unspecified: Secondary | ICD-10-CM | POA: Diagnosis not present

## 2022-07-20 DIAGNOSIS — I10 Essential (primary) hypertension: Secondary | ICD-10-CM | POA: Diagnosis not present

## 2022-07-20 DIAGNOSIS — I700 Atherosclerosis of aorta: Secondary | ICD-10-CM | POA: Diagnosis not present

## 2022-07-20 DIAGNOSIS — Z6829 Body mass index (BMI) 29.0-29.9, adult: Secondary | ICD-10-CM | POA: Diagnosis not present

## 2022-07-20 DIAGNOSIS — Z79899 Other long term (current) drug therapy: Secondary | ICD-10-CM | POA: Diagnosis not present

## 2022-07-21 ENCOUNTER — Telehealth: Payer: Self-pay | Admitting: Family

## 2022-07-21 NOTE — Telephone Encounter (Signed)
Left message for patient to call back and schedule Medicare Annual Wellness Visit (AWV) either virtually or phone   Left  my Victoria Whitehead number [phone]   awvi 03/24/21 per [place]  please schedule with Nurse Health Adviser   45 min for awv-i and in office appointments 30 min for awv-s  phone/virtual appointments

## 2022-08-07 ENCOUNTER — Other Ambulatory Visit: Payer: Self-pay | Admitting: Family

## 2022-08-07 DIAGNOSIS — J3089 Other allergic rhinitis: Secondary | ICD-10-CM

## 2022-08-08 NOTE — Telephone Encounter (Signed)
Requested Prescriptions  Pending Prescriptions Disp Refills   loratadine (CLARITIN) 10 MG tablet [Pharmacy Med Name: Loratadine 10 MG Oral Tablet] 90 tablet 0    Sig: Take 1 tablet by mouth once daily     Ear, Nose, and Throat:  Antihistamines 2 Failed - 08/07/2022  6:48 PM      Failed - Cr in normal range and within 360 days    Creat  Date Value Ref Range Status  08/05/2015 0.49 (L) 0.50 - 1.05 mg/dL Final   Creatinine, Ser  Date Value Ref Range Status  05/27/2021 0.57 0.57 - 1.00 mg/dL Final         Passed - Valid encounter within last 12 months    Recent Outpatient Visits           10 months ago Essential (primary) hypertension   Primary Care at [facility], [place], NP   1 year ago Essential hypertension   Primary Care at [facility], Amy J, NP   1 year ago Left-sided chest wall pain   [place] Elsie Stain, MD   1 year ago Centrilobular emphysema [facility])   [place] Elsie Stain, MD   1 year ago Essential hypertension   Primary Care at [facility], Flonnie Hailstone, NP

## 2022-08-15 IMAGING — CT CT CHEST W/O CM
2 of 4 series · 15 of 36 positions shown, 18 images · non-contrast
Comparison: 09/15/2015 chest CT.

CLINICAL DATA: Chronic left lower chest wall pain. Smoker. COPD.
History of rectal cancer.

EXAM:
CT CHEST WITHOUT CONTRAST
TECHNIQUE: Multidetector CT imaging of the chest was performed following the
standard protocol without IV contrast.

[Series 2: thorax · axial · 0.72mm/px · z∈[-456,-208]mm · 12 of 148 slices shown, 15 images]
[im 12/148  mediastinal]
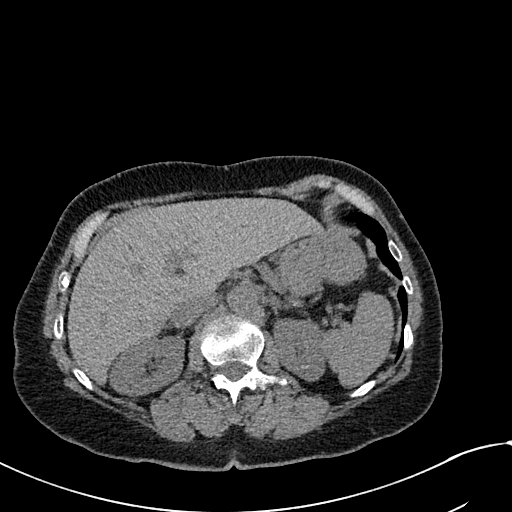
[im 12/148  lung]
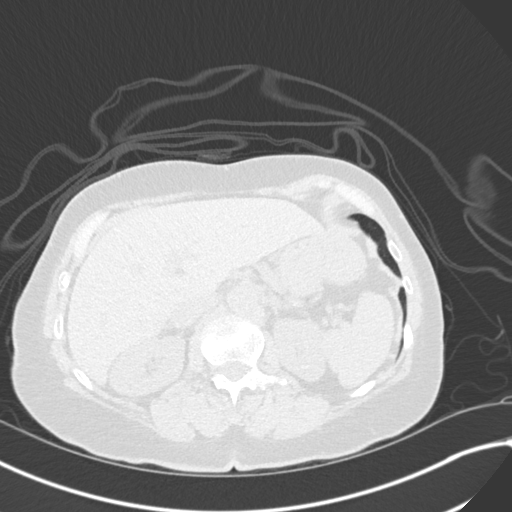
[im 23/148  lung]
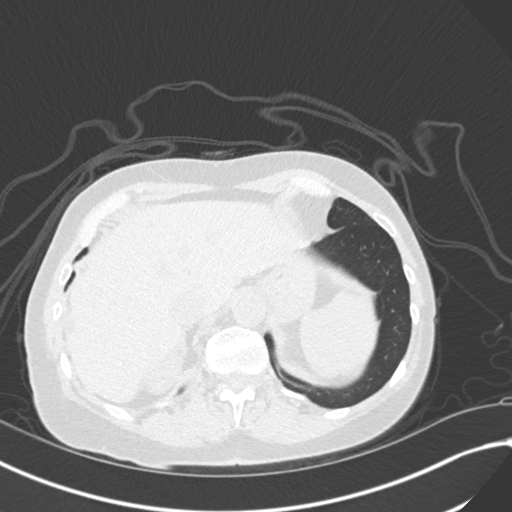
[im 34/148  lung]
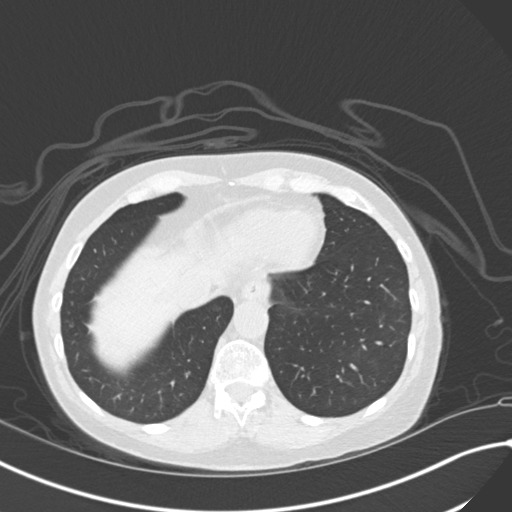
[im 46/148  lung]
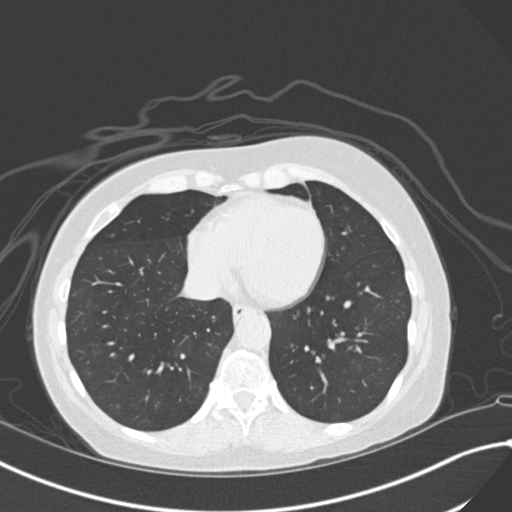
[im 57/148  mediastinal]
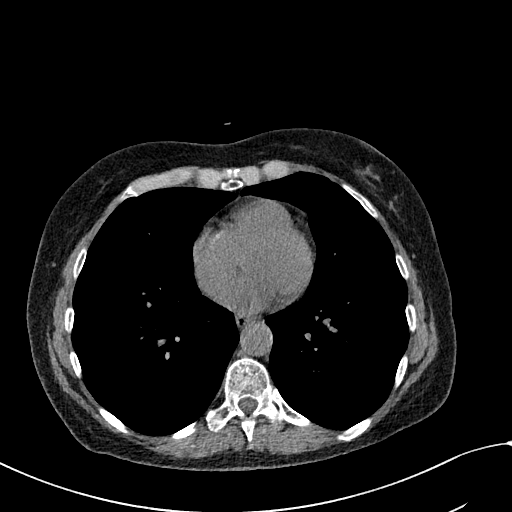
[im 57/148  lung]
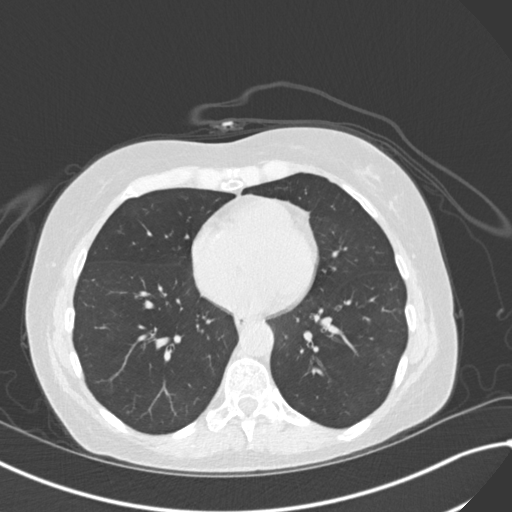
[im 68/148  lung]
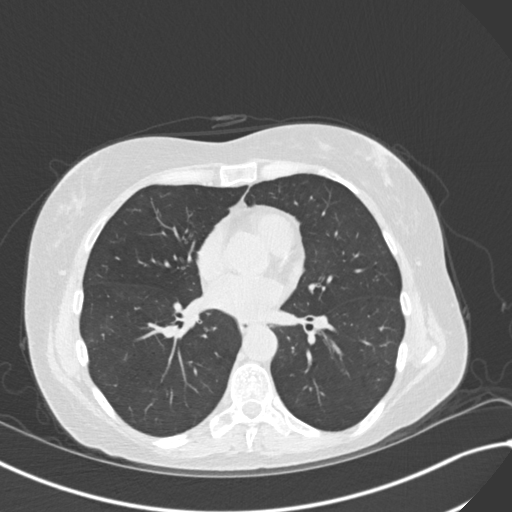
[im 80/148  lung]
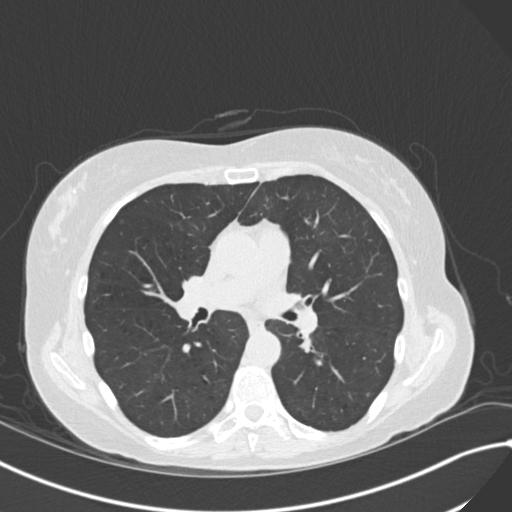
[im 91/148  lung]
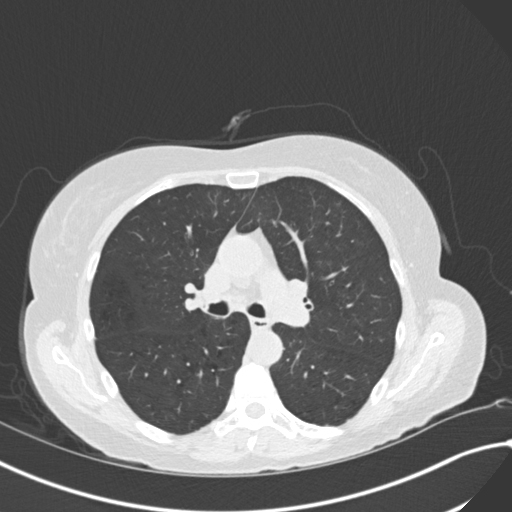
[im 102/148  mediastinal]
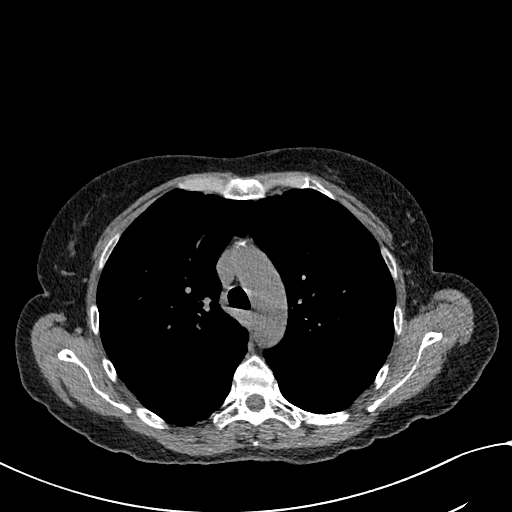
[im 102/148  lung]
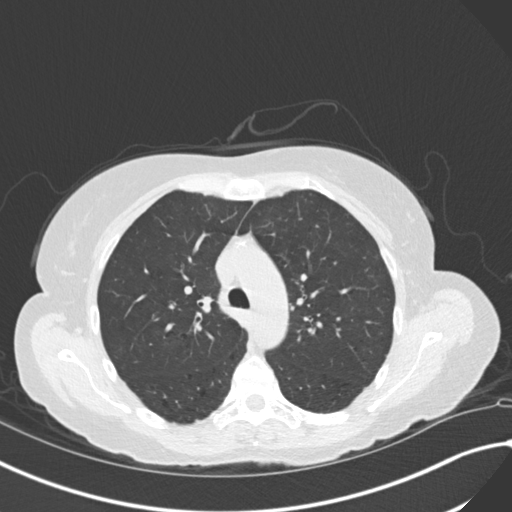
[im 114/148  lung]
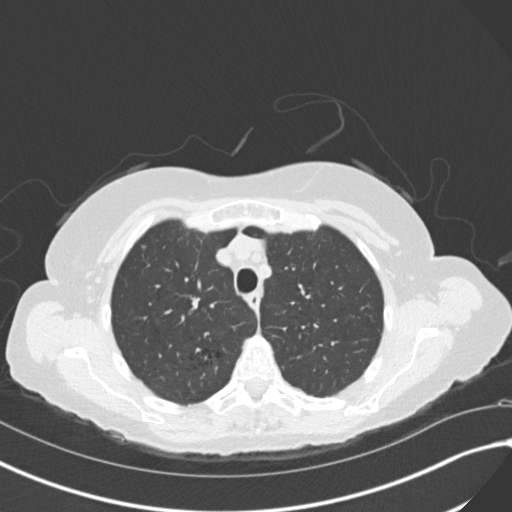
[im 125/148  lung]
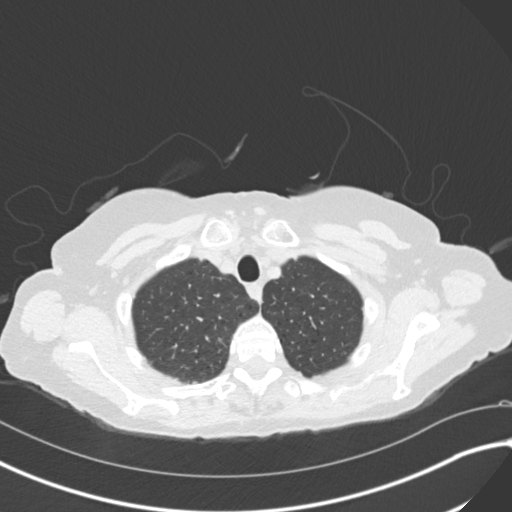
[im 136/148  lung]
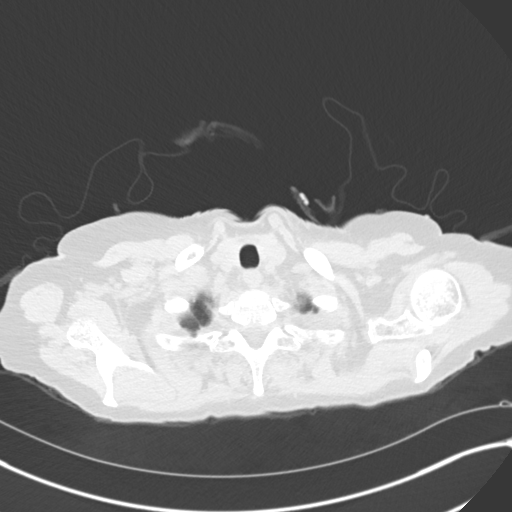

[Series 5: coronal · coronal · 0.59mm/px · 3 of 131 slices shown]
[im 27/131  lung]
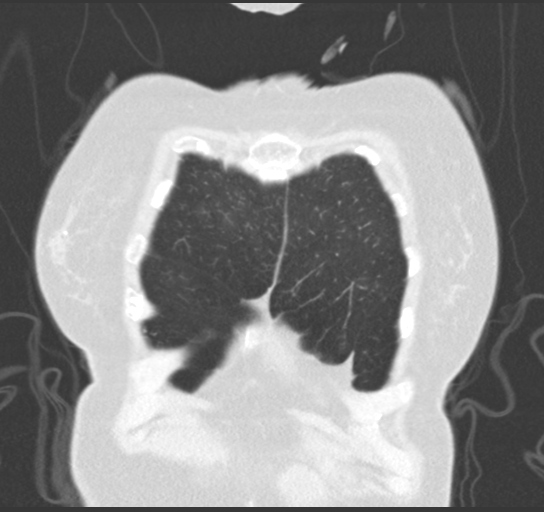
[im 53/131  lung]
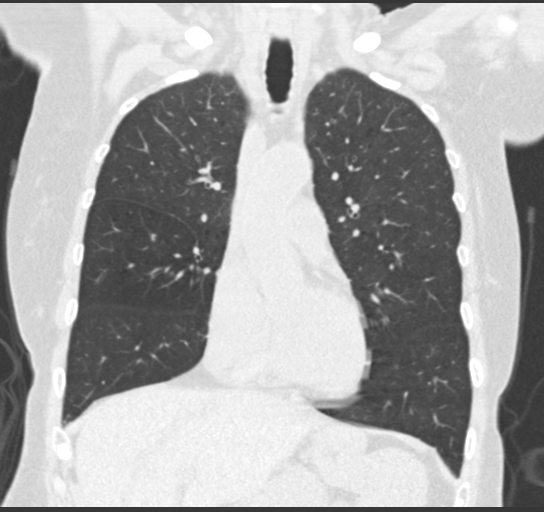
[im 79/131  lung]
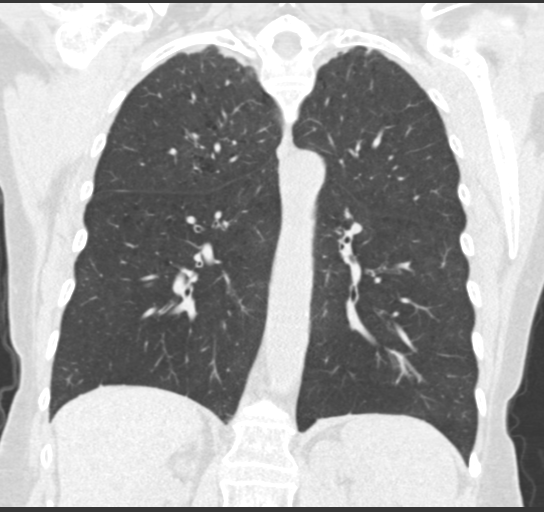

[15 of 36 positions shown; findings below may reference images not displayed]

FINDINGS: Cardiovascular: Normal heart size. No significant pericardial
effusion/thickening. Left anterior descending and right coronary
atherosclerosis. Atherosclerotic nonaneurysmal thoracic aorta.
Normal caliber pulmonary arteries.

Mediastinum/Nodes: No discrete thyroid nodules. Unremarkable
esophagus. No pathologically enlarged axillary, mediastinal or hilar
lymph nodes, noting limited sensitivity for the detection of hilar
adenopathy on this noncontrast study.

Lungs/Pleura: No pneumothorax. No pleural effusion. No significant
pleural thickening or discrete pleural lesions. Mild centrilobular
and paraseptal emphysema with diffuse bronchial wall thickening. No
acute consolidative airspace disease or lung masses. A few
previously visualized scattered tiny solid pulmonary nodules,
largest 3 mm in the medial right upper lobe (series 7/image 30), all
unchanged since 09/15/2015 chest CT and considered benign. A few new
tiny pulmonary nodules, largest 2 mm in the peripheral basilar right
lower lobe (series 7/image 118).

Upper abdomen: Three scattered subcentimeter hypodense liver lesions
are too small to characterize, at least 2 of which in the segment 8
right upper lobe (series 2/image 123) and caudate lobe (series
2/image 133) were not definitely seen on 09/15/2015 chest CT.

Musculoskeletal: No aggressive appearing focal osseous lesions. Mild
thoracic spondylosis.
IMPRESSION: 1. No significant pleural thickening or discrete pleural lesions. No
pleural effusions.
2. A few new tiny pulmonary nodules, largest 2 mm, not clearly
visualized on prior 1607 chest CT, which could be due to technical
differences in slice thickness. Suggest attention on follow-up chest
CT in 3-6 months given the history of rectal cancer.
3. Two subcentimeter hypodense liver lesions, not definitely seen on
09/15/2015 chest CT, too small to characterize. Recommend attention
on follow-up MRI abdomen without and with IV contrast in 3-6 months
given the history of rectal cancer.
4. Two-vessel coronary atherosclerosis.
5. Aortic Atherosclerosis (0DL9C-0MA.A) and Emphysema (0DL9C-7VE.X).

## 2022-09-29 DIAGNOSIS — K769 Liver disease, unspecified: Secondary | ICD-10-CM | POA: Diagnosis not present

## 2022-09-29 DIAGNOSIS — Z85048 Personal history of other malignant neoplasm of rectum, rectosigmoid junction, and anus: Secondary | ICD-10-CM | POA: Diagnosis not present

## 2022-10-30 IMAGING — CT CT CARDIAC CORONARY ARTERY CALCIUM SCORE
3 series · 14 of 20 positions shown, 16 images · non-contrast
Comparison: None.
COMPARISON: None.

Addendum:
EXAM:
OVER-READ INTERPRETATION  CT CHEST

The following report is an over-read performed by radiologist Dr.
Roshawn Canelas [REDACTED] on 03/29/2021. This
over-read does not include interpretation of cardiac or coronary
anatomy or pathology. The coronary calcium score interpretation by
the cardiologist is attached.
CLINICAL DATA: Cardiovascular Disease Risk stratification
Coronary Calcium Score
TECHNIQUE: A gated, non-contrast computed tomography scan of the heart was
performed using 3mm slice thickness. Axial images were analyzed on a
dedicated workstation. Calcium scoring of the coronary arteries was
performed using the Agatston method.

[Series 2: cascseq 2.0 sa36 70% (id) · axial · 0.39mm/px · z∈[-214,-134]mm · 4 of 68 slices shown]
[im 14/68  vessel]
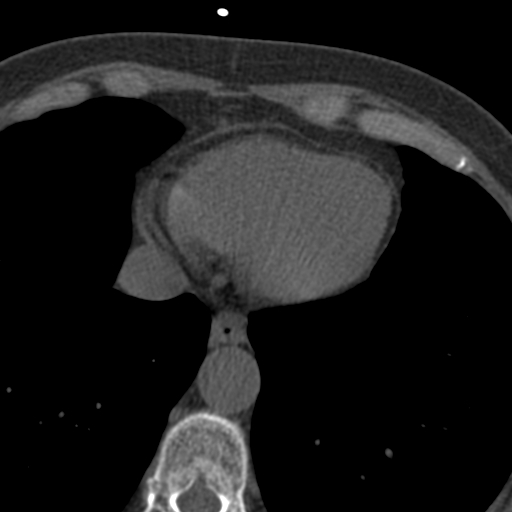
[im 27/68  vessel]
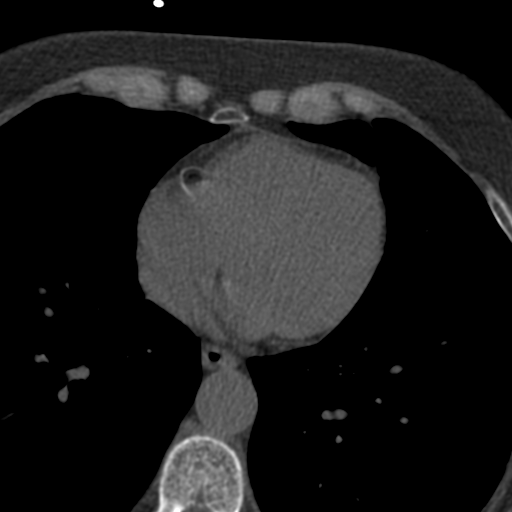
[im 41/68  vessel]
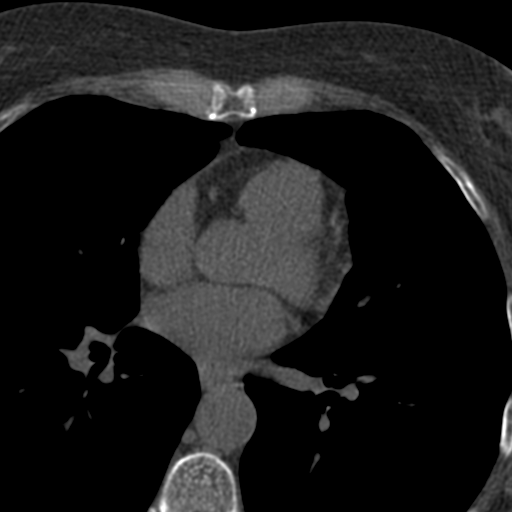
[im 54/68  vessel]
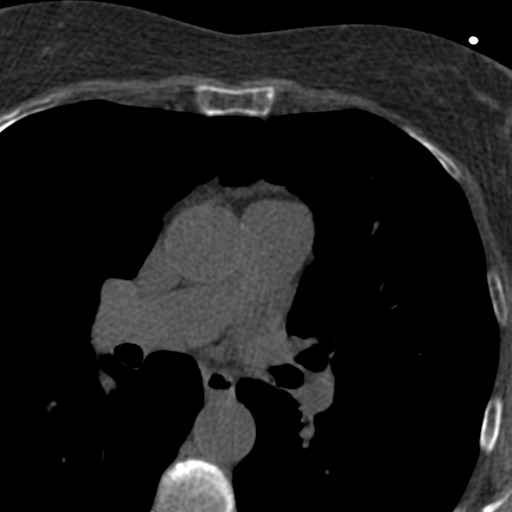

[Series 3: cascseq 2.0 bf37 st · axial · 0.66mm/px · z∈[-218,-130]mm · 5 of 68 slices shown, 7 images]
[im 12/68  vessel]
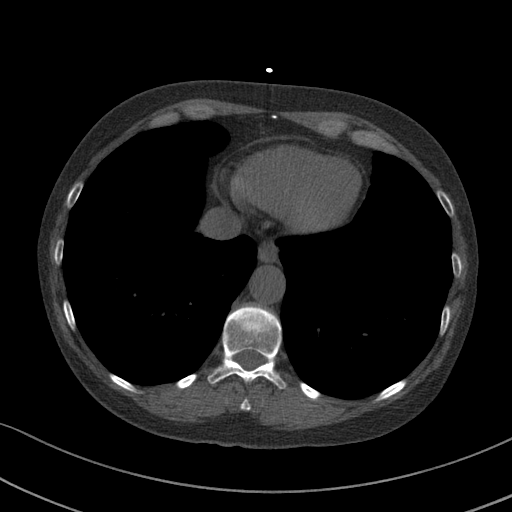
[im 12/68  lung]
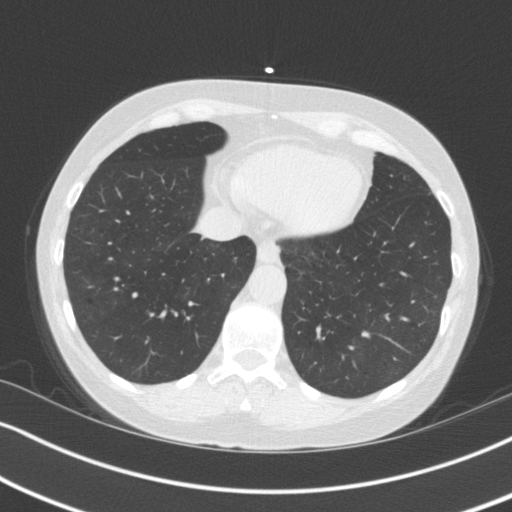
[im 23/68  vessel]
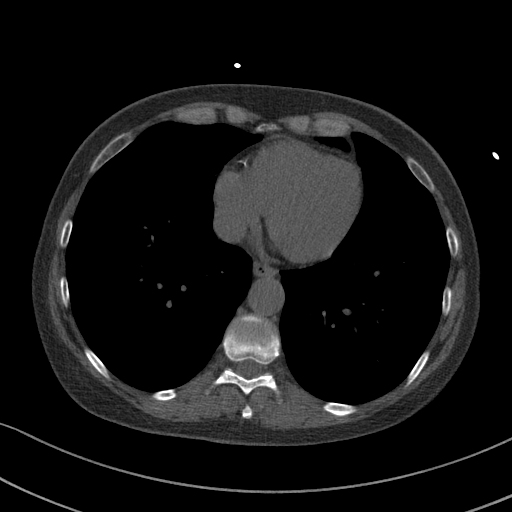
[im 34/68  vessel]
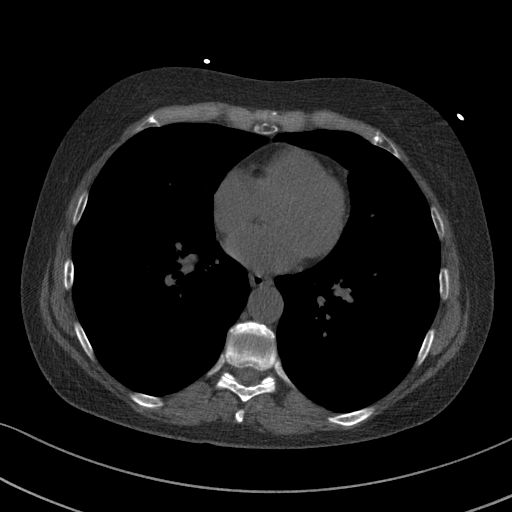
[im 45/68  vessel]
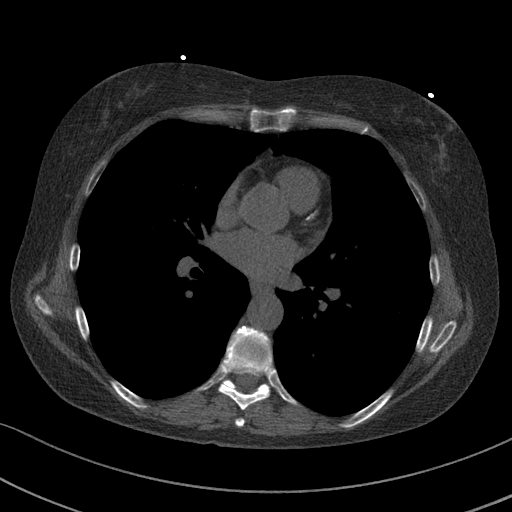
[im 56/68  vessel]
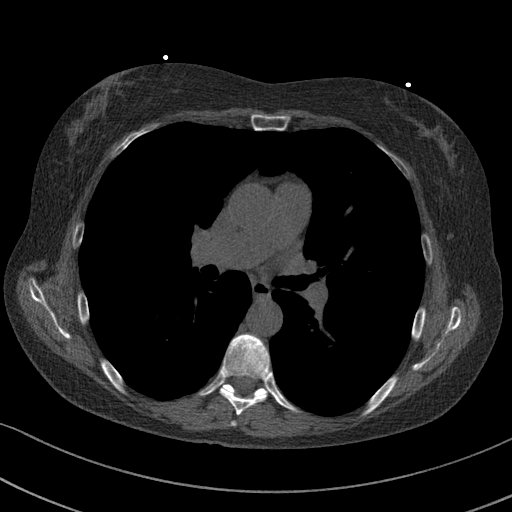
[im 56/68  lung]
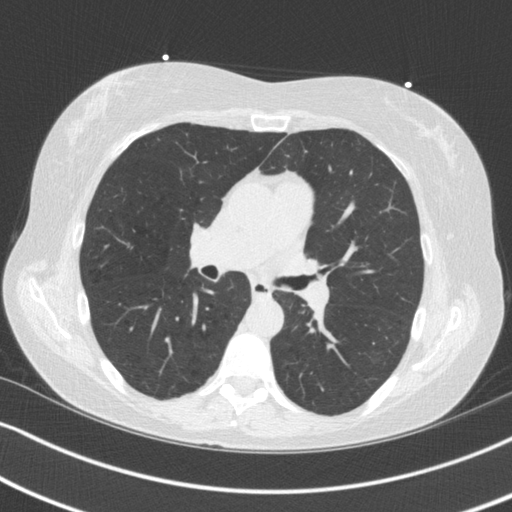

[Series 4: cascseq 2.0 br59 lung · axial · 0.66mm/px · z∈[-218,-130]mm · 5 of 68 slices shown]
[im 12/68  lung]
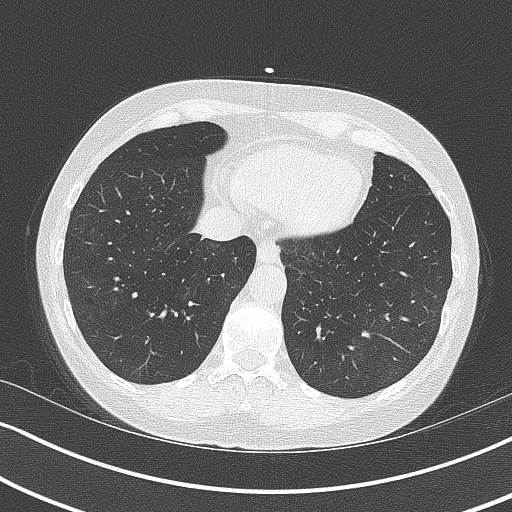
[im 23/68  lung]
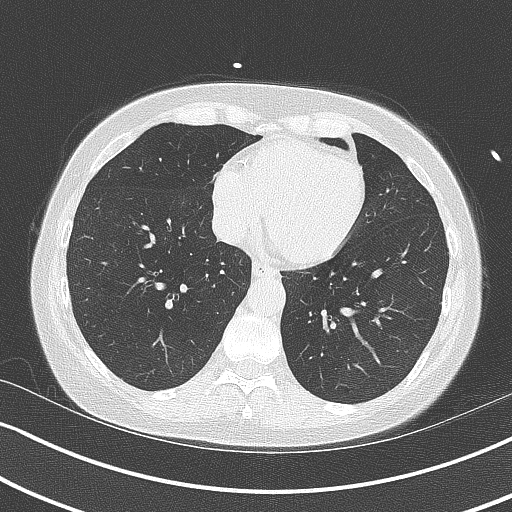
[im 34/68  lung]
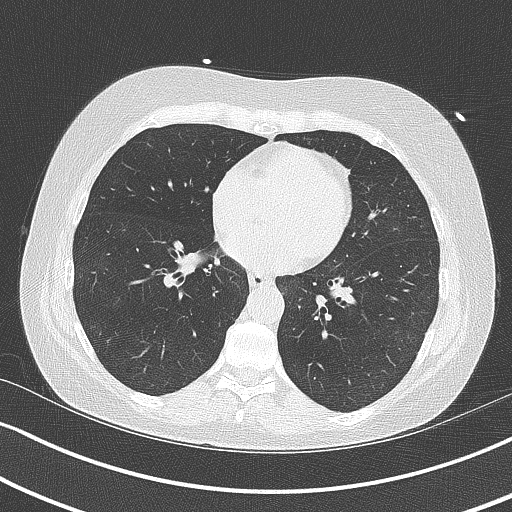
[im 45/68  lung]
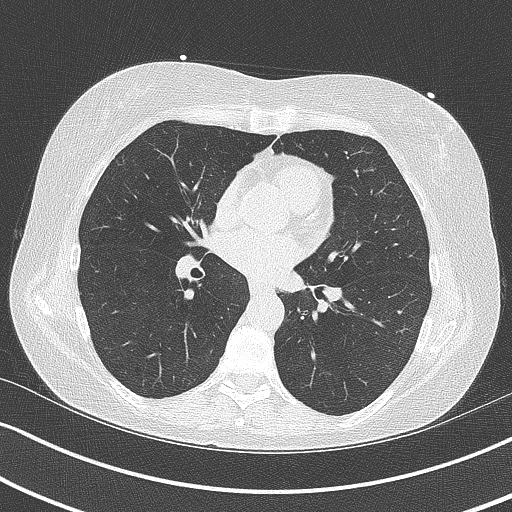
[im 56/68  lung]
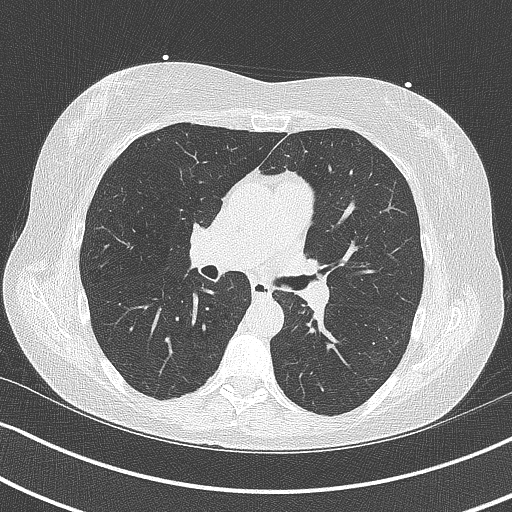

[14 of 20 positions shown; findings below may reference images not displayed]

FINDINGS: Within the visualized portions of the thorax there are no suspicious
appearing pulmonary nodules or masses, there is no acute
consolidative airspace disease, no pleural effusions, no
pneumothorax and no lymphadenopathy. Visualized portions of the
upper abdomen demonstrates low attenuation throughout the visualized
hepatic parenchyma, indicative of hepatic steatosis. There are no
aggressive appearing lytic or blastic lesions noted in the
visualized portions of the skeleton.
IMPRESSION: 1. Hepatic steatosis.
FINDINGS: Coronary arteries: Normal origins.

Coronary Calcium Score:

Left main: 0

Left anterior descending artery: 71

Left circumflex artery: 0

Right coronary artery: 5

Total: 126

Percentile: 84th

Pericardium: Normal.

Aorta: Normal caliber of ascending aorta. No aortic atherosclerosis
noted.

Non-cardiac: See separate report from [REDACTED].
IMPRESSION: Coronary calcium score of 126. This was 84th percentile for age-,
race-, and sex-matched controls.



If CAC=0, it is reasonable to withhold statin therapy and reassess
in 5 to 10 years, as long as higher risk conditions are absent
(diabetes mellitus, family history of premature CHD in first degree
relatives (males <55 years; females <65 years), cigarette smoking,
or LDL >=190 mg/dL).

If CAC is 1 to 99, it is reasonable to initiate statin therapy for
patients >=55 years of age.

If CAC is >=100 or >=75th percentile, it is reasonable to initiate
statin therapy at any age.

Cardiology referral should be considered for patients with CAC
scores >=400 or >=75th percentile.

*4346 AHA/ACC/AACVPR/AAPA/ABC/LUPITHA/ENEAS/MARARAC/Vann/VILLANUEVA/HOM/TOR-ANDRE
Guideline on the Management of Blood Cholesterol: A Report of the
American College of Cardiology/American Heart Association Task Force
on Clinical Practice Guidelines. J Am Coll Cardiol.
1635;73(24):5290-5084.

*** End of Addendum ***
EXAM:
OVER-READ INTERPRETATION  CT CHEST

The following report is an over-read performed by radiologist Dr.
Roshawn Canelas [REDACTED] on 03/29/2021. This
over-read does not include interpretation of cardiac or coronary
anatomy or pathology. The coronary calcium score interpretation by
the cardiologist is attached.
FINDINGS: Within the visualized portions of the thorax there are no suspicious
appearing pulmonary nodules or masses, there is no acute
consolidative airspace disease, no pleural effusions, no
pneumothorax and no lymphadenopathy. Visualized portions of the
upper abdomen demonstrates low attenuation throughout the visualized
hepatic parenchyma, indicative of hepatic steatosis. There are no
aggressive appearing lytic or blastic lesions noted in the
visualized portions of the skeleton.
IMPRESSION: 1. Hepatic steatosis.

## 2022-11-01 ENCOUNTER — Other Ambulatory Visit: Payer: Self-pay | Admitting: Family

## 2022-11-01 DIAGNOSIS — T782XXA Anaphylactic shock, unspecified, initial encounter: Secondary | ICD-10-CM

## 2022-11-01 DIAGNOSIS — J3089 Other allergic rhinitis: Secondary | ICD-10-CM

## 2022-11-04 ENCOUNTER — Other Ambulatory Visit: Payer: Self-pay | Admitting: Family

## 2022-11-04 DIAGNOSIS — J3089 Other allergic rhinitis: Secondary | ICD-10-CM

## 2022-11-06 NOTE — Telephone Encounter (Signed)
Patient called, left VM to return the call to the office to scheduled an appt for medication refill request.    Requested Prescriptions  Pending Prescriptions Disp Refills   loratadine (CLARITIN) 10 MG tablet [Pharmacy Med Name: Loratadine 10 MG Oral Tablet] 90 tablet 0    Sig: Take 1 tablet by mouth once daily     Ear, Nose, and Throat:  Antihistamines 2 Failed - 11/04/2022 10:01 AM      Failed - Cr in normal range and within 360 days    Creat  Date Value Ref Range Status  08/05/2015 0.49 (L) 0.50 - 1.05 mg/dL Final   Creatinine, Ser  Date Value Ref Range Status  05/27/2021 0.57 0.57 - 1.00 mg/dL Final         Failed - Valid encounter within last 12 months    Recent Outpatient Visits           1 year ago Essential (primary) hypertension   [place] [facility] at [facility], [place], NP   1 year ago Essential hypertension   [place] [facility], [place], NP   1 year ago Left-sided chest wall pain   [place] [facility] Storm Frisk, MD   2 years ago Centrilobular emphysema [facility])   [place] [facility] Storm Frisk, MD   2 years ago Essential hypertension   [place] [facility] at [facility], Salomon Fick, NP

## 2022-11-10 ENCOUNTER — Telehealth: Payer: Self-pay | Admitting: Cardiovascular Disease

## 2022-11-10 NOTE — Telephone Encounter (Signed)
Patient is requesting to switch providers from Dr. Flora Lipps to Dr. Bing Matter. Please advise.

## 2022-12-01 DIAGNOSIS — L821 Other seborrheic keratosis: Secondary | ICD-10-CM | POA: Diagnosis not present

## 2022-12-01 DIAGNOSIS — L578 Other skin changes due to chronic exposure to nonionizing radiation: Secondary | ICD-10-CM | POA: Diagnosis not present

## 2022-12-01 DIAGNOSIS — L814 Other melanin hyperpigmentation: Secondary | ICD-10-CM | POA: Diagnosis not present

## 2022-12-01 DIAGNOSIS — D485 Neoplasm of uncertain behavior of skin: Secondary | ICD-10-CM | POA: Diagnosis not present

## 2023-01-12 DIAGNOSIS — Z6829 Body mass index (BMI) 29.0-29.9, adult: Secondary | ICD-10-CM | POA: Diagnosis not present

## 2023-01-12 DIAGNOSIS — M542 Cervicalgia: Secondary | ICD-10-CM | POA: Diagnosis not present

## 2023-01-18 DIAGNOSIS — Z79899 Other long term (current) drug therapy: Secondary | ICD-10-CM | POA: Diagnosis not present

## 2023-01-18 DIAGNOSIS — R5383 Other fatigue: Secondary | ICD-10-CM | POA: Diagnosis not present

## 2023-01-18 DIAGNOSIS — I10 Essential (primary) hypertension: Secondary | ICD-10-CM | POA: Diagnosis not present

## 2023-01-18 DIAGNOSIS — Z6829 Body mass index (BMI) 29.0-29.9, adult: Secondary | ICD-10-CM | POA: Diagnosis not present

## 2023-01-18 DIAGNOSIS — K625 Hemorrhage of anus and rectum: Secondary | ICD-10-CM | POA: Diagnosis not present

## 2023-01-18 DIAGNOSIS — E785 Hyperlipidemia, unspecified: Secondary | ICD-10-CM | POA: Diagnosis not present

## 2023-01-18 DIAGNOSIS — J449 Chronic obstructive pulmonary disease, unspecified: Secondary | ICD-10-CM | POA: Diagnosis not present

## 2023-01-18 DIAGNOSIS — I700 Atherosclerosis of aorta: Secondary | ICD-10-CM | POA: Diagnosis not present

## 2023-01-24 ENCOUNTER — Ambulatory Visit: Payer: PPO | Attending: Cardiology | Admitting: Cardiology

## 2023-01-24 ENCOUNTER — Encounter: Payer: Self-pay | Admitting: Cardiology

## 2023-01-24 VITALS — BP 138/70 | HR 88 | Ht 62.0 in | Wt 163.0 lb

## 2023-01-24 DIAGNOSIS — Z720 Tobacco use: Secondary | ICD-10-CM

## 2023-01-24 DIAGNOSIS — I700 Atherosclerosis of aorta: Secondary | ICD-10-CM

## 2023-01-24 DIAGNOSIS — E782 Mixed hyperlipidemia: Secondary | ICD-10-CM | POA: Diagnosis not present

## 2023-01-24 DIAGNOSIS — I2510 Atherosclerotic heart disease of native coronary artery without angina pectoris: Secondary | ICD-10-CM | POA: Diagnosis not present

## 2023-01-24 DIAGNOSIS — R0609 Other forms of dyspnea: Secondary | ICD-10-CM | POA: Diagnosis not present

## 2023-01-24 NOTE — Patient Instructions (Signed)

## 2023-01-24 NOTE — Progress Notes (Signed)
Cardiology Office Note:    Date:  01/24/2023   ID:  Victoria Whitehead, DOB Apr 17, 1956, MRN [id number]  PCP:  Eunice Blase, PA-C  Cardiologist:  Gypsy Balsam, MD    Referring MD: No ref. provider found   Chief Complaint  Patient presents with   Follow-up  Doing fine  History of Present Illness:    Victoria Whitehead is a 67 y.o. female past medical history significant of coronary artery disease.  In 2022 she was evaluated for calcification of the coronary arteries, she did have a calcium score done she was in 84 percentile, stress test done at that time showed no evidence of ischemia.  The key was risk factors modifications.  She comes today to months for follow-up overall seems to be doing well.  Denies have any chest pain tightness squeezing pressure burning chest.  Sadly she still continues to smoke.  She does have history of colon cancer recently started having some bleeding again she is scheduled to have gastroscopy and colonoscopy.  Still continues to smoke.  Past Medical History:  Diagnosis Date   Allergy    Arthritis    Blood transfusion without reported diagnosis    Phreesia 08/16/2020   Cancer (HCC)    COPD (chronic obstructive pulmonary disease) (HCC)    Phreesia 08/16/2020   Depression    Phreesia 08/16/2020   Emphysema of lung (HCC)    Phreesia 08/16/2020   Family history of adverse reaction to anesthesia    Mom woke up during Colonscopy .   Hypertension    Phreesia 08/16/2020   Osteoporosis    Phreesia 08/16/2020   Rectal cancer [facility])     Past Surgical History:  Procedure Laterality Date   CESAREAN SECTION  07/24/1978   CESAREAN SECTION N/A    Phreesia 08/16/2020   COLON SURGERY     COLONOSCOPY     2013 polyps removed   COLONOSCOPY WITH PROPOFOL N/A 09/06/2015   Procedure: COLONOSCOPY WITH PROPOFOL;  Surgeon: Charolett Bumpers, MD;  Location: [facility];  Service: Endoscopy;  Laterality: N/A;   DILATION AND CURETTAGE OF UTERUS     ILEOSTOMY   07/24/2009   ILEOSTOMY CLOSURE  01/21/2010   left knee surgery     had screws and pins-does not bend at knee area   left knee surgery      removal of rectum  07/24/2009   TONSILLECTOMY     TUBAL LIGATION  07/24/1981    Current Medications: Current Meds  Medication Sig   albuterol (VENTOLIN HFA) 108 (90 Base) MCG/ACT inhaler Inhale 2 puffs into the lungs every 4 (four) hours as needed for wheezing or shortness of breath.   aspirin EC 81 MG tablet Take 1 tablet (81 mg total) by mouth daily. Swallow whole.   budesonide-formoterol (SYMBICORT) 160-4.5 MCG/ACT inhaler Inhale 2 puffs into the lungs 2 (two) times daily.   EPINEPHrine 0.3 mg/0.3 mL IJ SOAJ injection Inject 0.3 mg into the muscle as needed for anaphylaxis.   loratadine (CLARITIN) 10 MG tablet Take 1 tablet by mouth once daily   losartan (COZAAR) 100 MG tablet Take 1 tablet by mouth once daily   rosuvastatin (CRESTOR) 20 MG tablet Take 1 tablet (20 mg total) by mouth daily.     Allergies:   Other, Bee venom, Hydrogen peroxide, Neosporin [neomycin-bacitracin zn-polymyx], and Zinc   Social History   Socioeconomic History   Marital status: Single    Spouse name: Not on file   Number of children: Not  on file   Years of education: Not on file   Highest education level: Not on file  Occupational History   Occupation: Disabled  Tobacco Use   Smoking status: Every Day    Packs/day: 1.00    Years: 50.00    Additional pack years: 0.00    Total pack years: 50.00    Types: Cigarettes   Smokeless tobacco: Never   Tobacco comments:    40 year smoker  Vaping Use   Vaping Use: Never used  Substance and Sexual Activity   Alcohol use: No   Drug use: No   Sexual activity: Not Currently  Other Topics Concern   Not on file  Social History Narrative   Not on file   Social Determinants of Health   Financial Resource Strain: Not on file  Food Insecurity: Not on file  Transportation Needs: Not on file  Physical Activity: Not  on file  Stress: Not on file  Social Connections: Not on file     Family History: The patient's family history includes COPD in her mother; Cancer in her sister; Diabetes in her daughter and maternal grandmother; Heart disease in her maternal grandmother and mother; Hypertension in her sister; Stroke in her maternal grandmother. ROS:   Please see the history of present illness.    All 14 point review of systems negative except as described per history of present illness  EKGs/Labs/Other Studies Reviewed:         Recent Labs: No results found for requested labs within last 365 days.  Recent Lipid Panel    Component Value Date/Time   CHOL 144 11/01/2020 1127   TRIG 79 11/01/2020 1127   HDL 70 11/01/2020 1127   CHOLHDL 2.1 11/01/2020 1127   CHOLHDL 2.9 04/14/2014 1103   VLDL 16 04/14/2014 1103   LDLCALC 59 11/01/2020 1127    Physical Exam:    VS:  BP 138/70 (BP Location: Left Arm, Patient Position: Sitting)   Pulse 88   Ht 5\' 2"  (1.575 m)   Wt 163 lb (73.9 kg)   SpO2 95%   BMI 29.81 kg/m     Wt Readings from Last 3 Encounters:  01/24/23 163 lb (73.9 kg)  09/21/21 147 lb (66.7 kg)  05/27/21 140 lb (63.5 kg)     GEN:  Well nourished, well developed in no acute distress HEENT: Normal NECK: No JVD; No carotid bruits LYMPHATICS: No lymphadenopathy CARDIAC: RRR, no murmurs, no rubs, no gallops RESPIRATORY:  Clear to auscultation without rales, wheezing or rhonchi  ABDOMEN: Soft, non-tender, non-distended MUSCULOSKELETAL:  No edema; No deformity  SKIN: Warm and dry LOWER EXTREMITIES: no swelling NEUROLOGIC:  Alert and oriented x 3 PSYCHIATRIC:  Normal affect   ASSESSMENT:    1. Aortic atherosclerosis (HCC)   2. Coronary artery disease involving native coronary artery of native heart without angina pectoris   3. Mixed hyperlipidemia   4. Tobacco abuse    PLAN:    In order of problems listed above:  Coronary calcifications noted on CT, evaluation 2022  negative.  She is asymptomatic.  Will continue risk factors modifications.  Will continue her aspirin as well as cholesterol reduction. Dyslipidemia I did review K PN which show me her LDL 66 HDL 75 she is taking Crestor 20 which I will continue.  She is also on aspirin. Tobacco abuse we spent at least 5 minutes talking about need to quit she understands she will try to do that. Dyspnea on exertion multifactorial I will  ask her to have an echocardiogram done to check her left ventricle ejection fraction.   Medication Adjustments/Labs and Tests Ordered: Current medicines are reviewed at length with the patient today.  Concerns regarding medicines are outlined above.  Orders Placed This Encounter  Procedures   EKG 12-Lead   Medication changes: No orders of the defined types were placed in this encounter.   Signed, Georgeanna Lea, MD, [facility] 01/24/2023 11:44 AM    [facility]

## 2023-01-24 NOTE — Addendum Note (Signed)
Addended by: Baldo Ash D on: 01/24/2023 11:58 AM   Modules accepted: Orders

## 2023-02-21 ENCOUNTER — Ambulatory Visit: Payer: PPO

## 2023-02-21 DIAGNOSIS — R0609 Other forms of dyspnea: Secondary | ICD-10-CM | POA: Diagnosis not present

## 2023-02-21 LAB — ECHOCARDIOGRAM COMPLETE: S' Lateral: 2.6 cm

## 2023-02-23 ENCOUNTER — Telehealth: Payer: Self-pay

## 2023-02-23 NOTE — Telephone Encounter (Signed)
Unable to reach or LM ?

## 2023-02-23 NOTE — Telephone Encounter (Signed)
-----   Message from Gypsy Balsam sent at 02/23/2023 11:25 AM EDT ----- Echocardiogram showed preserved left ventricle ejection fraction, mild to moderate mitral valve regurgitation

## 2023-02-26 DIAGNOSIS — Z1231 Encounter for screening mammogram for malignant neoplasm of breast: Secondary | ICD-10-CM | POA: Diagnosis not present

## 2023-03-05 NOTE — Telephone Encounter (Signed)
Pt was calling regarding results and is requesting a callback. Please advise

## 2023-03-06 NOTE — Telephone Encounter (Signed)
Patient notified of results.

## 2023-03-08 ENCOUNTER — Telehealth: Payer: Self-pay

## 2023-03-08 NOTE — Telephone Encounter (Signed)
Patient notified of results.

## 2023-03-08 NOTE — Telephone Encounter (Signed)
-----   Message from Gypsy Balsam sent at 02/23/2023 11:25 AM EDT ----- Echocardiogram showed preserved left ventricle ejection fraction, mild to moderate mitral valve regurgitation

## 2023-03-30 DIAGNOSIS — K921 Melena: Secondary | ICD-10-CM | POA: Diagnosis not present

## 2023-03-30 DIAGNOSIS — C189 Malignant neoplasm of colon, unspecified: Secondary | ICD-10-CM | POA: Diagnosis not present

## 2023-03-30 DIAGNOSIS — K624 Stenosis of anus and rectum: Secondary | ICD-10-CM | POA: Diagnosis not present

## 2023-03-30 DIAGNOSIS — K639 Disease of intestine, unspecified: Secondary | ICD-10-CM | POA: Diagnosis not present

## 2023-04-04 DIAGNOSIS — Z85048 Personal history of other malignant neoplasm of rectum, rectosigmoid junction, and anus: Secondary | ICD-10-CM | POA: Diagnosis not present

## 2023-04-04 DIAGNOSIS — K624 Stenosis of anus and rectum: Secondary | ICD-10-CM | POA: Diagnosis not present

## 2023-04-04 DIAGNOSIS — Z980 Intestinal bypass and anastomosis status: Secondary | ICD-10-CM | POA: Diagnosis not present

## 2023-04-04 DIAGNOSIS — K921 Melena: Secondary | ICD-10-CM | POA: Diagnosis not present

## 2023-06-24 ENCOUNTER — Other Ambulatory Visit: Payer: Self-pay | Admitting: Family

## 2023-06-24 DIAGNOSIS — T782XXA Anaphylactic shock, unspecified, initial encounter: Secondary | ICD-10-CM

## 2023-06-25 NOTE — Telephone Encounter (Signed)
Complete

## 2023-09-19 DIAGNOSIS — I700 Atherosclerosis of aorta: Secondary | ICD-10-CM | POA: Diagnosis not present

## 2023-09-19 DIAGNOSIS — B351 Tinea unguium: Secondary | ICD-10-CM | POA: Diagnosis not present

## 2023-09-19 DIAGNOSIS — Z6829 Body mass index (BMI) 29.0-29.9, adult: Secondary | ICD-10-CM | POA: Diagnosis not present

## 2023-09-19 DIAGNOSIS — Z79899 Other long term (current) drug therapy: Secondary | ICD-10-CM | POA: Diagnosis not present

## 2023-09-19 DIAGNOSIS — J449 Chronic obstructive pulmonary disease, unspecified: Secondary | ICD-10-CM | POA: Diagnosis not present

## 2023-09-19 DIAGNOSIS — E785 Hyperlipidemia, unspecified: Secondary | ICD-10-CM | POA: Diagnosis not present

## 2023-09-19 DIAGNOSIS — I10 Essential (primary) hypertension: Secondary | ICD-10-CM | POA: Diagnosis not present

## 2024-03-19 DIAGNOSIS — Z6827 Body mass index (BMI) 27.0-27.9, adult: Secondary | ICD-10-CM | POA: Diagnosis not present

## 2024-03-19 DIAGNOSIS — J449 Chronic obstructive pulmonary disease, unspecified: Secondary | ICD-10-CM | POA: Diagnosis not present

## 2024-03-19 DIAGNOSIS — Z1231 Encounter for screening mammogram for malignant neoplasm of breast: Secondary | ICD-10-CM | POA: Diagnosis not present

## 2024-03-19 DIAGNOSIS — I10 Essential (primary) hypertension: Secondary | ICD-10-CM | POA: Diagnosis not present

## 2024-03-19 DIAGNOSIS — Z79899 Other long term (current) drug therapy: Secondary | ICD-10-CM | POA: Diagnosis not present

## 2024-03-19 DIAGNOSIS — E785 Hyperlipidemia, unspecified: Secondary | ICD-10-CM | POA: Diagnosis not present

## 2024-06-05 DIAGNOSIS — H9319 Tinnitus, unspecified ear: Secondary | ICD-10-CM | POA: Diagnosis not present

## 2024-06-05 DIAGNOSIS — I10 Essential (primary) hypertension: Secondary | ICD-10-CM | POA: Diagnosis not present

## 2024-06-05 DIAGNOSIS — Z6826 Body mass index (BMI) 26.0-26.9, adult: Secondary | ICD-10-CM | POA: Diagnosis not present

## 2024-06-05 DIAGNOSIS — E663 Overweight: Secondary | ICD-10-CM | POA: Diagnosis not present

## 2024-06-06 ENCOUNTER — Telehealth: Payer: Self-pay | Admitting: Cardiology

## 2024-06-06 NOTE — Telephone Encounter (Signed)
 Called the patient and she stated that over the past two weeks she has developed a ringing noise in her ears like crickets chirping. The only new medicine that she has recently started was Magnesium. Spoke to Dr. Krasowski to see if the Magnesium could be causing the ringing in her ears and he said no it does not do that. Patient also stated that she saw her PCP yesterday and he referred her to an ENT doctor. Dr. Krasowski recommended that the patient keep that appointment. I relayed this information to the patient and she verbalized understanding and had no further questions at this time.

## 2024-06-06 NOTE — Telephone Encounter (Signed)
 Patient stated she has been having a ringing in her ears which sounds like crickets and she thinks this may be heart related.

## 2024-07-28 DIAGNOSIS — F32A Depression, unspecified: Secondary | ICD-10-CM | POA: Insufficient documentation

## 2024-07-28 DIAGNOSIS — Z8489 Family history of other specified conditions: Secondary | ICD-10-CM | POA: Insufficient documentation

## 2024-07-28 DIAGNOSIS — I10 Essential (primary) hypertension: Secondary | ICD-10-CM | POA: Insufficient documentation

## 2024-07-28 DIAGNOSIS — T7840XA Allergy, unspecified, initial encounter: Secondary | ICD-10-CM | POA: Insufficient documentation

## 2024-07-28 DIAGNOSIS — C20 Malignant neoplasm of rectum: Secondary | ICD-10-CM | POA: Insufficient documentation

## 2024-07-28 DIAGNOSIS — Z5189 Encounter for other specified aftercare: Secondary | ICD-10-CM | POA: Insufficient documentation

## 2024-07-28 DIAGNOSIS — C801 Malignant (primary) neoplasm, unspecified: Secondary | ICD-10-CM | POA: Insufficient documentation

## 2024-07-31 ENCOUNTER — Encounter: Payer: Self-pay | Admitting: Cardiology

## 2024-07-31 ENCOUNTER — Ambulatory Visit: Attending: Cardiology | Admitting: Cardiology

## 2024-07-31 VITALS — BP 130/68 | HR 82 | Ht 61.0 in | Wt 143.4 lb

## 2024-07-31 DIAGNOSIS — J3089 Other allergic rhinitis: Secondary | ICD-10-CM | POA: Diagnosis not present

## 2024-07-31 DIAGNOSIS — I2510 Atherosclerotic heart disease of native coronary artery without angina pectoris: Secondary | ICD-10-CM

## 2024-07-31 DIAGNOSIS — E782 Mixed hyperlipidemia: Secondary | ICD-10-CM | POA: Diagnosis not present

## 2024-07-31 DIAGNOSIS — R0609 Other forms of dyspnea: Secondary | ICD-10-CM | POA: Diagnosis not present

## 2024-07-31 DIAGNOSIS — J439 Emphysema, unspecified: Secondary | ICD-10-CM

## 2024-07-31 DIAGNOSIS — Z720 Tobacco use: Secondary | ICD-10-CM | POA: Diagnosis not present

## 2024-07-31 NOTE — Patient Instructions (Signed)
 Medication Instructions:  Your physician recommends that you continue on your current medications as directed. Please refer to the Current Medication list given to you today.  *If you need a refill on your cardiac medications before your next appointment, please call your pharmacy*   Lab Work: None Ordered If you have labs (blood work) drawn today and your tests are completely normal, you will receive your results only by: MyChart Message (if you have MyChart) OR A paper copy in the mail If you have any lab test that is abnormal or we need to change your treatment, we will call you to review the results.   Testing/Procedures: Your physician has requested that you have an echocardiogram. Echocardiography is a painless test that uses sound waves to create images of your heart. It provides your doctor with information about the size and shape of your heart and how well your heart's chambers and valves are working. This procedure takes approximately one hour. There are no restrictions for this procedure. Please do NOT wear cologne, perfume, aftershave, or lotions (deodorant is allowed). Please arrive 15 minutes prior to your appointment time.  Please note: We ask at that you not bring children with you during ultrasound (echo/ vascular) testing. Due to room size and safety concerns, children are not allowed in the ultrasound rooms during exams. Our front office staff cannot provide observation of children in our lobby area while testing is being conducted. An adult accompanying a patient to their appointment will only be allowed in the ultrasound room at the discretion of the ultrasound technician under special circumstances. We apologize for any inconvenience.    Follow-Up: At [facility], you and your health needs are our priority.  As part of our continuing mission to provide you with exceptional heart care, we have created designated Provider Care Teams.  These Care Teams include your  primary Cardiologist (physician) and Advanced Practice Providers (APPs -  Physician Assistants and Nurse Practitioners) who all work together to provide you with the care you need, when you need it.  We recommend signing up for the patient portal called MyChart.  Sign up information is provided on this After Visit Summary.  MyChart is used to connect with patients for Virtual Visits (Telemedicine).  Patients are able to view lab/test results, encounter notes, upcoming appointments, etc.  Non-urgent messages can be sent to your provider as well.   To learn more about what you can do with MyChart, go to [email or web address].    Your next appointment:   12 month(s)  The format for your next appointment:   In Person  Provider:   Lamar Fitch, MD    Other Instructions NA

## 2024-07-31 NOTE — Progress Notes (Signed)
 " Cardiology Office Note:    Date:  07/31/2024   ID:  Victoria Whitehead, DOB 1955/12/23, MRN [id number]  PCP:  Venancio Pock, PA-C  Cardiologist:  Lamar Fitch, MD    Referring MD: Venancio Pock, PA-C   No chief complaint on file. Doing fine  History of Present Illness:    Victoria Whitehead is a 69 y.o. female past medical history significant for coronary disease she did have calcification of the coronary artery calcium  score was 84 percentile, stress test done thereafter showed no evidence of ischemia.  He was risk modification.  Sadly she still continues to smoke.  Comes today to months for follow-up cardiac wise doing well.  Denies of any chest pain tightness squeezing pressure mid chest no palpitation dizziness swelling of lower extremities, overall doing well but shortness of breath appears to be [place] worse  Past Medical History:  Diagnosis Date   Age-related osteoporosis without current pathological fracture 05/27/2021   Allergy    Aortic atherosclerosis 01/14/2021   Arthritis    Bee sting allergy 05/19/2014   Blood transfusion without reported diagnosis    Phreesia 08/16/2020   CAD seen on CT imaging 01/14/2021   Cancer (HCC)    Centrilobular emphysema (HCC) 11/03/2020   Chronic left flank pain 08/16/2015   Chronic neck pain 04/14/2014   COPD (chronic obstructive pulmonary disease) (HCC)    Phreesia 08/16/2020   Depression    Phreesia 08/16/2020   Emphysema of lung (HCC)    Phreesia 08/16/2020   Family history of adverse reaction to anesthesia    Mom woke up during Colonscopy .   History of rectal cancer 04/14/2014   Hyperlipidemia 08/24/2020   Hypertension    Phreesia 08/16/2020   Osteoporosis    Phreesia 08/16/2020   Rectal cancer (HCC)    Throat fullness 08/16/2015   Tobacco abuse 05/19/2014    Past Surgical History:  Procedure Laterality Date   CESAREAN SECTION  07/24/1978   CESAREAN SECTION N/A    Phreesia 08/16/2020   COLON SURGERY      COLONOSCOPY     2013 polyps removed   COLONOSCOPY WITH PROPOFOL  N/A 09/06/2015   Procedure: COLONOSCOPY WITH PROPOFOL ;  Surgeon: Gladis MARLA Louder, MD;  Location: [facility];  Service: Endoscopy;  Laterality: N/A;   DILATION AND CURETTAGE OF UTERUS     ILEOSTOMY  07/24/2009   ILEOSTOMY CLOSURE  01/21/2010   left knee surgery     had screws and pins-does not bend at knee area   left knee surgery      removal of rectum  07/24/2009   TONSILLECTOMY     TUBAL LIGATION  07/24/1981    Current Medications: Active Medications[1]   Allergies:   Hydrocortisone, Zinc oxide, Other, Bee venom, Hydrogen peroxide, Neosporin [neomycin-bacitracin zn-polymyx], and Zinc   Social History   Socioeconomic History   Marital status: Single    Spouse name: Not on file   Number of children: Not on file   Years of education: Not on file   Highest education level: Not on file  Occupational History   Occupation: Disabled  Tobacco Use   Smoking status: Every Day    Current packs/day: 1.00    Average packs/day: 1 pack/day for 50.0 years (50.0 ttl pk-yrs)    Types: Cigarettes   Smokeless tobacco: Never   Tobacco comments:    40 year smoker  Vaping Use   Vaping status: Never Used  Substance and Sexual Activity   Alcohol use: No  Drug use: No   Sexual activity: Not Currently  Other Topics Concern   Not on file  Social History Narrative   Not on file   Social Drivers of Health   Tobacco Use: High Risk (07/31/2024)   Patient History    Smoking Tobacco Use: Every Day    Smokeless Tobacco Use: Never    Passive Exposure: Not on file  Financial Resource Strain: Not on file  Food Insecurity: Not on file  Transportation Needs: Not on file  Physical Activity: Not on file  Stress: Not on file  Social Connections: Not on file  Depression (PHQ2-9): Low Risk (09/21/2021)   Depression (PHQ2-9)    PHQ-2 Score: 0  Alcohol Screen: Not on file  Housing: Not on file  Utilities: Not on file  Health  Literacy: Not on file     Family History: The patient's family history includes COPD in her mother; Cancer in her sister; Diabetes in her daughter and maternal grandmother; Heart disease in her maternal grandmother and mother; Hypertension in her sister; Stroke in her maternal grandmother. ROS:   Please see the history of present illness.    All 14 point review of systems negative except as described per history of present illness  EKGs/Labs/Other Studies Reviewed:    EKG Interpretation Date/Time:  Thursday July 31 2024 11:35:21 EST Ventricular Rate:  82 PR Interval:  116 QRS Duration:  72 QT Interval:  358 QTC Calculation: 418 R Axis:   82  Text Interpretation: Normal sinus rhythm Septal infarct , age undetermined No previous ECGs available Confirmed by Bernie Charleston [id number]) on 07/31/2024 11:38:04 AM    Recent Labs: No results found for requested labs within last 365 days.  Recent Lipid Panel    Component Value Date/Time   CHOL 144 11/01/2020 1127   TRIG 79 11/01/2020 1127   HDL 70 11/01/2020 1127   CHOLHDL 2.1 11/01/2020 1127   CHOLHDL 2.9 04/14/2014 1103   VLDL 16 04/14/2014 1103   LDLCALC 59 11/01/2020 1127    Physical Exam:    VS:  BP 130/68   Pulse 82   Ht 5' 1 (1.549 m)   Wt 143 lb 6 oz (65 kg)   SpO2 97%   BMI 27.09 kg/m     Wt Readings from Last 3 Encounters:  07/31/24 143 lb 6 oz (65 kg)  01/24/23 163 lb (73.9 kg)  09/21/21 147 lb (66.7 kg)     GEN:  Well nourished, well developed in no acute distress HEENT: Normal NECK: No JVD; No carotid bruits LYMPHATICS: No lymphadenopathy CARDIAC: RRR, no murmurs, no rubs, no gallops RESPIRATORY:  Clear to auscultation without rales, wheezing or rhonchi  ABDOMEN: Soft, non-tender, non-distended MUSCULOSKELETAL:  No edema; No deformity  SKIN: Warm and dry LOWER EXTREMITIES: no swelling NEUROLOGIC:  Alert and oriented x 3 PSYCHIATRIC:  Normal affect   ASSESSMENT:    1. Mixed hyperlipidemia   2.  Perennial allergic rhinitis   3. Coronary artery disease involving native coronary artery of native heart without angina pectoris   4. Emphysema of lung (HCC)   5. Tobacco abuse    PLAN:    In order of problems listed above:  Coronary disease asymptomatic continue risk factors modification which include antiplatelets therapy and statin. Dyspnea on exertion multifactorial clearly smoking played some significant role here.  Will continue to encourage her to quit.  We had a long discussion about this. Mixed dyslipidemia she is taking Crestor  10 mg daily and I will  continue I did review KPN which show me data from August of this year LDL 53 and HDL 61 continue present management   Medication Adjustments/Labs and Tests Ordered: Current medicines are reviewed at length with the patient today.  Concerns regarding medicines are outlined above.  Orders Placed This Encounter  Procedures   EKG 12-Lead   Medication changes: No orders of the defined types were placed in this encounter.   Signed, Lamar DOROTHA Fitch, MD, [facility] 07/31/2024 12:02 PM    [facility]     [1]  Current Meds  Medication Sig   albuterol  (VENTOLIN  HFA) 108 (90 Base) MCG/ACT inhaler Inhale 2 puffs into the lungs every 4 (four) hours as needed for wheezing or shortness of breath.   aspirin  EC 81 MG tablet Take 1 tablet (81 mg total) by mouth daily. Swallow whole.   BEVESPI  AEROSPHERE 9-4.8 MCG/ACT AERO Inhale 2 puffs into the lungs daily.   EPINEPHrine  0.3 mg/0.3 mL IJ SOAJ injection INJECT 0.3MG  INTO THE MUSCLE AS NEEDED FOR ANAPHYLAXIS   loperamide  (IMODIUM ) 2 MG capsule Take 2 mg by mouth daily at 6 (six) AM.   losartan  (COZAAR ) 100 MG tablet Take 1 tablet by mouth once daily   rosuvastatin  (CRESTOR ) 10 MG tablet Take 10 mg by mouth at bedtime.   "

## 2024-08-26 ENCOUNTER — Ambulatory Visit

## 2024-09-24 ENCOUNTER — Ambulatory Visit
